# Patient Record
Sex: Female | Born: 1965 | Race: White | Hispanic: No | Smoking: Never smoker
Health system: Southern US, Community
[De-identification: ages and names within clinical notes are randomized; demographics above are authoritative.]

## PROBLEM LIST (undated history)

## (undated) DIAGNOSIS — N200 Calculus of kidney: Secondary | ICD-10-CM

## (undated) DIAGNOSIS — I10 Essential (primary) hypertension: Secondary | ICD-10-CM

## (undated) DIAGNOSIS — F32A Depression, unspecified: Secondary | ICD-10-CM

## (undated) DIAGNOSIS — F419 Anxiety disorder, unspecified: Secondary | ICD-10-CM

## (undated) DIAGNOSIS — E079 Disorder of thyroid, unspecified: Secondary | ICD-10-CM

## (undated) HISTORY — DX: Essential (primary) hypertension: I10

## (undated) HISTORY — DX: Anxiety disorder, unspecified: F41.9

## (undated) HISTORY — DX: Disorder of thyroid, unspecified: E07.9

## (undated) HISTORY — PX: BLADDER SURGERY: SHX569

## (undated) HISTORY — DX: Depression, unspecified: F32.A

## (undated) HISTORY — PX: APPENDECTOMY: SHX54

## (undated) HISTORY — DX: Calculus of kidney: N20.0

---

## 2008-08-16 ENCOUNTER — Ambulatory Visit: Payer: Self-pay | Admitting: Family Medicine

## 2008-08-16 DIAGNOSIS — R1031 Right lower quadrant pain: Secondary | ICD-10-CM

## 2008-08-16 DIAGNOSIS — F17200 Nicotine dependence, unspecified, uncomplicated: Secondary | ICD-10-CM

## 2008-08-16 DIAGNOSIS — D509 Iron deficiency anemia, unspecified: Secondary | ICD-10-CM

## 2008-08-16 DIAGNOSIS — L659 Nonscarring hair loss, unspecified: Secondary | ICD-10-CM | POA: Insufficient documentation

## 2008-08-17 ENCOUNTER — Encounter: Payer: Self-pay | Admitting: Family Medicine

## 2008-08-18 ENCOUNTER — Encounter: Admission: RE | Admit: 2008-08-18 | Discharge: 2008-08-18 | Payer: Self-pay | Admitting: Family Medicine

## 2008-08-18 LAB — CONVERTED CEMR LAB
ALT: 13 units/L (ref 0–35)
Alkaline Phosphatase: 86 units/L (ref 39–117)
Basophils Absolute: 0.1 10*3/uL (ref 0.0–0.1)
Basophils Relative: 1 % (ref 0–1)
CO2: 20 meq/L (ref 19–32)
Cholesterol: 154 mg/dL (ref 0–200)
Creatinine, Ser: 0.85 mg/dL (ref 0.40–1.20)
Eosinophils Absolute: 0.3 10*3/uL (ref 0.0–0.7)
Eosinophils Relative: 3 % (ref 0–5)
Ferritin: 41 ng/mL (ref 10–291)
HCT: 41 % (ref 36.0–46.0)
Hemoglobin: 14.1 g/dL (ref 12.0–15.0)
LDL Cholesterol: 85 mg/dL (ref 0–99)
Lymphocytes Relative: 28 % (ref 12–46)
MCHC: 34.4 g/dL (ref 30.0–36.0)
MCV: 94.5 fL (ref 78.0–100.0)
Monocytes Absolute: 0.6 10*3/uL (ref 0.1–1.0)
Platelets: 302 10*3/uL (ref 150–400)
RDW: 12.6 % (ref 11.5–15.5)
Sodium: 139 meq/L (ref 135–145)
Total Bilirubin: 0.5 mg/dL (ref 0.3–1.2)
Total CHOL/HDL Ratio: 4.2
Total Protein: 7.7 g/dL (ref 6.0–8.3)
Triglycerides: 160 mg/dL — ABNORMAL HIGH (ref <150)
VLDL: 32 mg/dL (ref 0–40)

## 2008-08-25 ENCOUNTER — Ambulatory Visit: Payer: Self-pay | Admitting: Family Medicine

## 2008-08-25 DIAGNOSIS — J984 Other disorders of lung: Secondary | ICD-10-CM | POA: Insufficient documentation

## 2008-08-25 DIAGNOSIS — J069 Acute upper respiratory infection, unspecified: Secondary | ICD-10-CM | POA: Insufficient documentation

## 2008-10-10 ENCOUNTER — Telehealth: Payer: Self-pay | Admitting: Family Medicine

## 2008-12-26 ENCOUNTER — Ambulatory Visit: Payer: Self-pay | Admitting: Family Medicine

## 2008-12-26 DIAGNOSIS — R1013 Epigastric pain: Secondary | ICD-10-CM

## 2008-12-26 DIAGNOSIS — K3189 Other diseases of stomach and duodenum: Secondary | ICD-10-CM | POA: Insufficient documentation

## 2009-01-16 ENCOUNTER — Ambulatory Visit: Payer: Self-pay | Admitting: Family Medicine

## 2009-01-16 DIAGNOSIS — H6120 Impacted cerumen, unspecified ear: Secondary | ICD-10-CM

## 2009-04-11 ENCOUNTER — Ambulatory Visit: Payer: Self-pay | Admitting: Family Medicine

## 2009-04-11 DIAGNOSIS — J309 Allergic rhinitis, unspecified: Secondary | ICD-10-CM | POA: Insufficient documentation

## 2009-04-11 DIAGNOSIS — J019 Acute sinusitis, unspecified: Secondary | ICD-10-CM | POA: Insufficient documentation

## 2009-04-12 ENCOUNTER — Ambulatory Visit: Payer: Self-pay | Admitting: Family Medicine

## 2009-04-12 DIAGNOSIS — K649 Unspecified hemorrhoids: Secondary | ICD-10-CM | POA: Insufficient documentation

## 2009-04-20 ENCOUNTER — Ambulatory Visit: Payer: Self-pay | Admitting: Critical Care Medicine

## 2009-04-20 DIAGNOSIS — K219 Gastro-esophageal reflux disease without esophagitis: Secondary | ICD-10-CM

## 2009-04-21 ENCOUNTER — Ambulatory Visit (HOSPITAL_BASED_OUTPATIENT_CLINIC_OR_DEPARTMENT_OTHER): Admission: RE | Admit: 2009-04-21 | Discharge: 2009-04-21 | Payer: Self-pay | Admitting: Critical Care Medicine

## 2009-04-21 ENCOUNTER — Ambulatory Visit: Payer: Self-pay | Admitting: Diagnostic Radiology

## 2009-04-24 ENCOUNTER — Ambulatory Visit: Payer: Self-pay | Admitting: Occupational Medicine

## 2009-04-24 ENCOUNTER — Telehealth (INDEPENDENT_AMBULATORY_CARE_PROVIDER_SITE_OTHER): Payer: Self-pay | Admitting: *Deleted

## 2009-04-24 DIAGNOSIS — L509 Urticaria, unspecified: Secondary | ICD-10-CM | POA: Insufficient documentation

## 2009-04-25 ENCOUNTER — Telehealth: Payer: Self-pay | Admitting: Family Medicine

## 2009-04-27 ENCOUNTER — Ambulatory Visit: Payer: Self-pay | Admitting: Radiology

## 2009-04-27 ENCOUNTER — Encounter: Payer: Self-pay | Admitting: Critical Care Medicine

## 2009-04-27 ENCOUNTER — Ambulatory Visit (HOSPITAL_BASED_OUTPATIENT_CLINIC_OR_DEPARTMENT_OTHER): Admission: RE | Admit: 2009-04-27 | Discharge: 2009-04-27 | Payer: Self-pay | Admitting: Critical Care Medicine

## 2009-09-05 ENCOUNTER — Ambulatory Visit: Payer: Self-pay | Admitting: Family Medicine

## 2009-09-08 ENCOUNTER — Telehealth: Payer: Self-pay | Admitting: Family Medicine

## 2009-11-25 IMAGING — CT CT ABDOMEN W/ CM
2 of 5 series · 17 of 46 positions shown, 19 images · IV contrast (agent unspecified)
Comparison: None

CT ABDOMEN

CLINICAL DATA: Right lower quadrant pain for several months with
diarrhea

CT ABDOMEN AND PELVIS WITH CONTRAST
TECHNIQUE: Multidetector CT imaging of the abdomen and pelvis was
performed using the standard protocol following bolus
administration of intravenous contrast.
Contrast: 125 ml Amnipaque-LVV

[Series 2: abdomen w/ · axial · 0.91mm/px · z∈[-468,-83]mm · 14 of 89 slices shown, 16 images]
[im 6/89  soft-tissue]
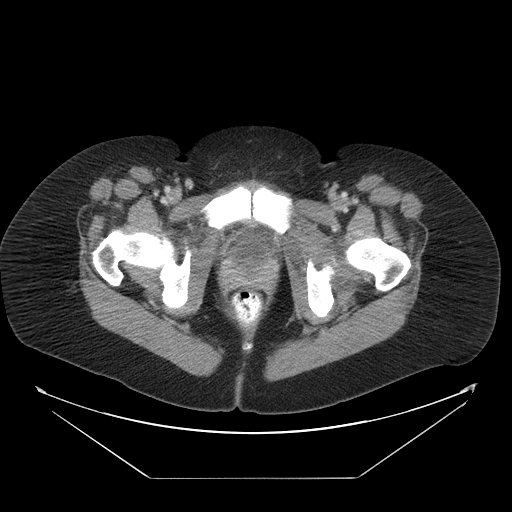
[im 6/89  bone]
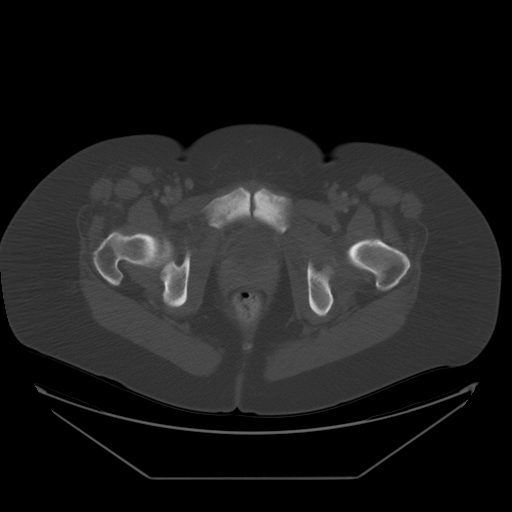
[im 11/89  soft-tissue]
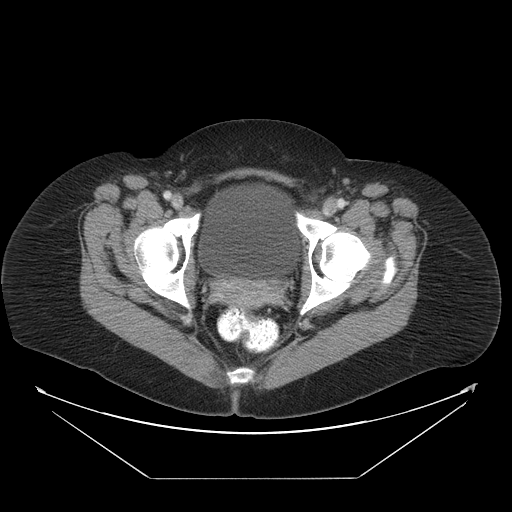
[im 16/89  soft-tissue]
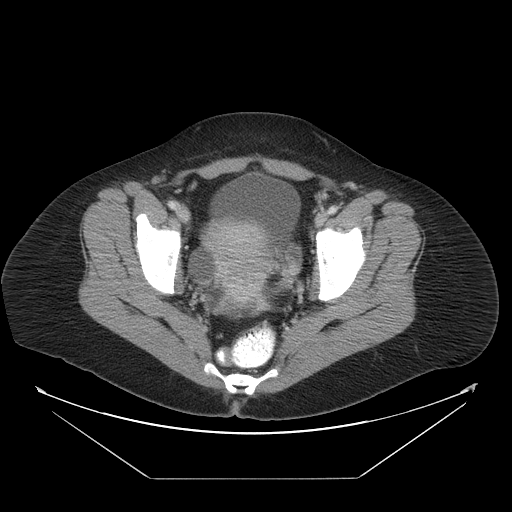
[im 26/89  soft-tissue]
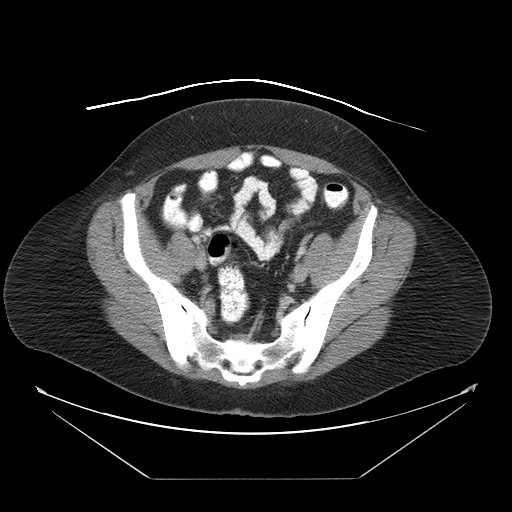
[im 32/89  soft-tissue]
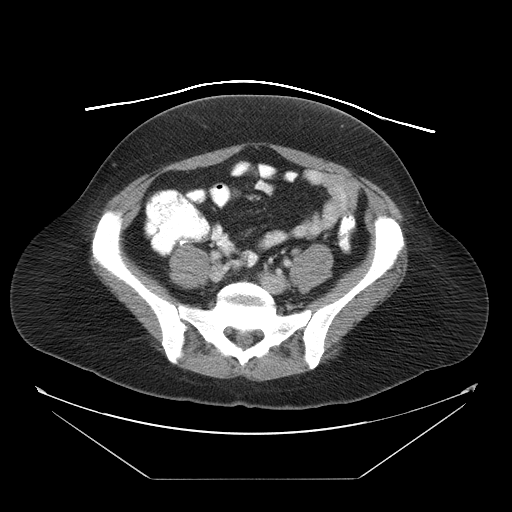
[im 37/89  soft-tissue]
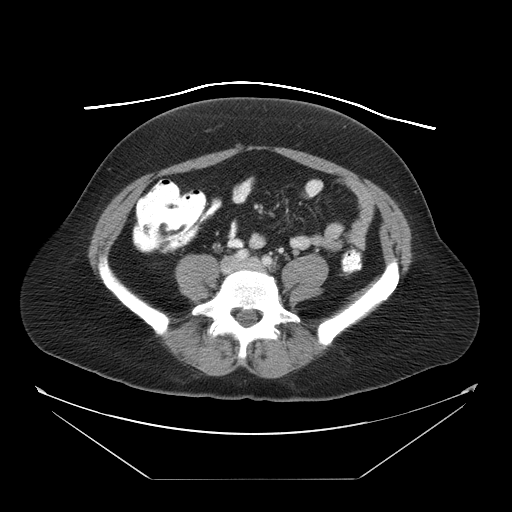
[im 42/89  soft-tissue]
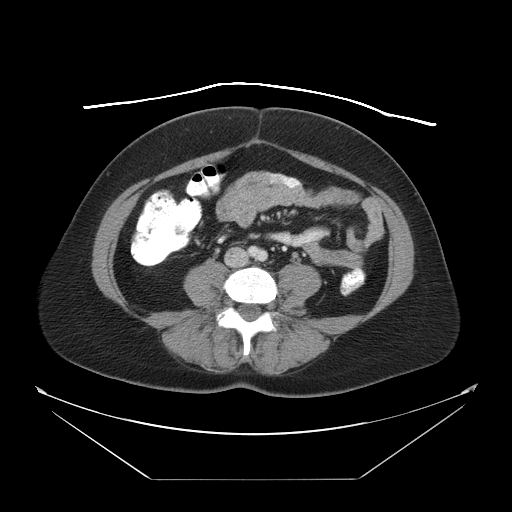
[im 47/89  soft-tissue]
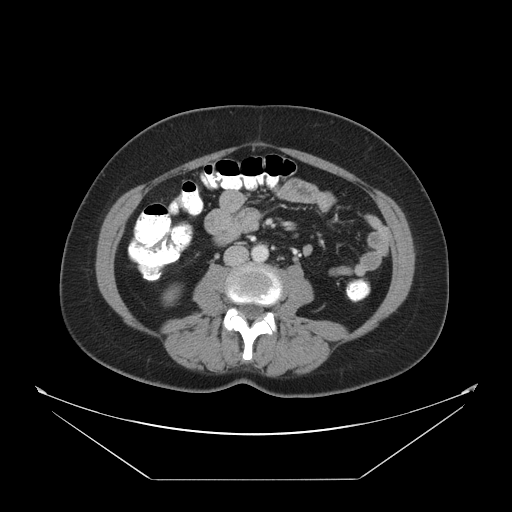
[im 52/89  soft-tissue]
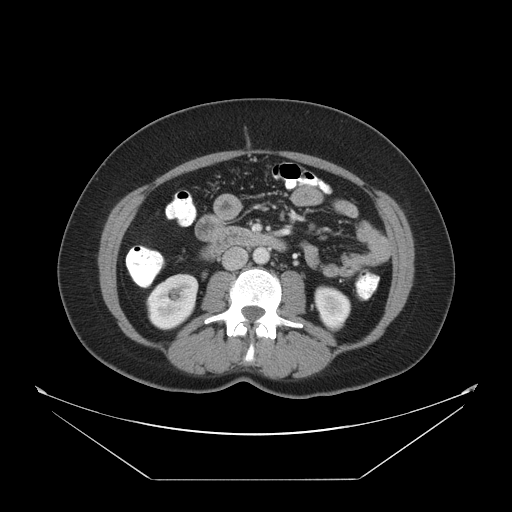
[im 52/89  bone]
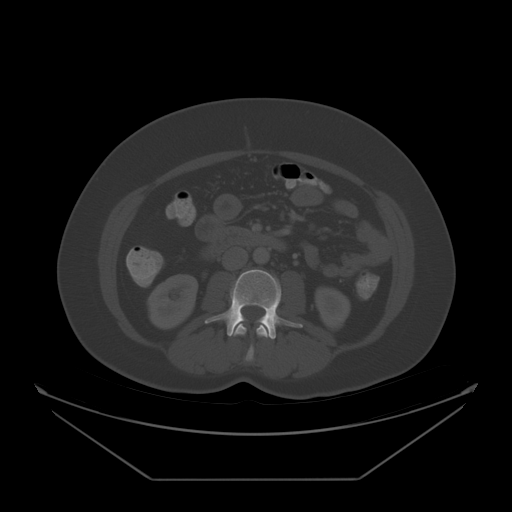
[im 57/89  soft-tissue]
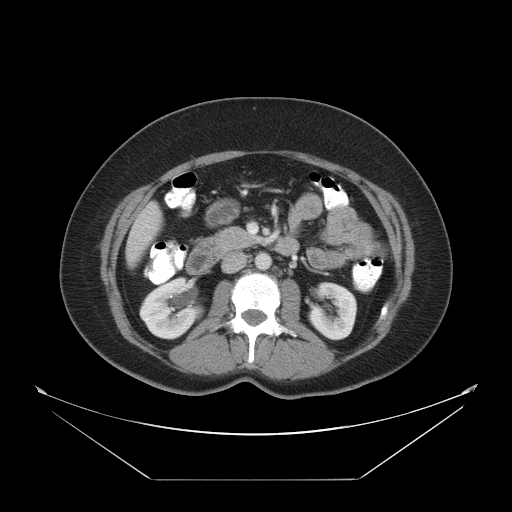
[im 68/89  soft-tissue]
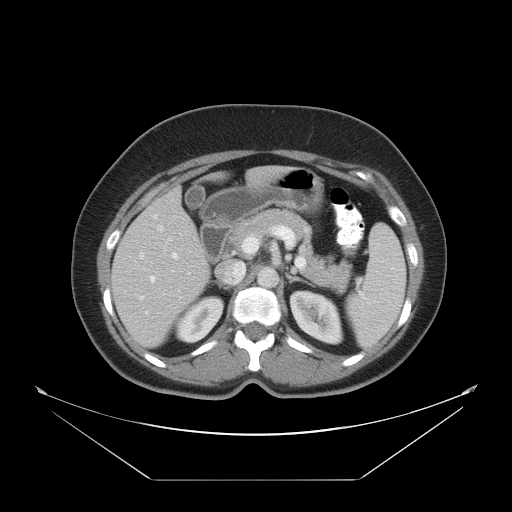
[im 73/89  soft-tissue]
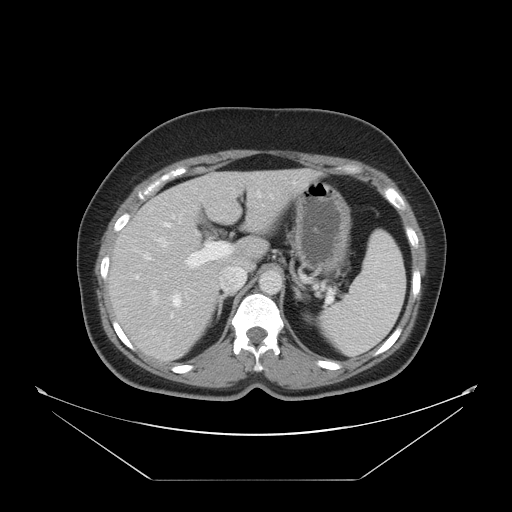
[im 78/89  soft-tissue]
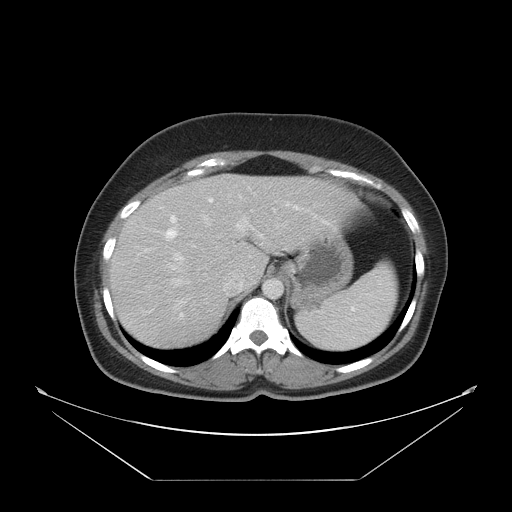
[im 83/89  soft-tissue]
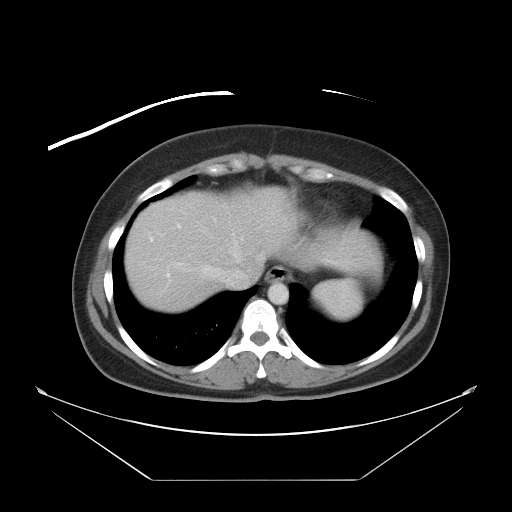

[Series 401: cor · coronal · 0.93mm/px · 3 of 118 slices shown]
[im 40/118  soft-tissue]
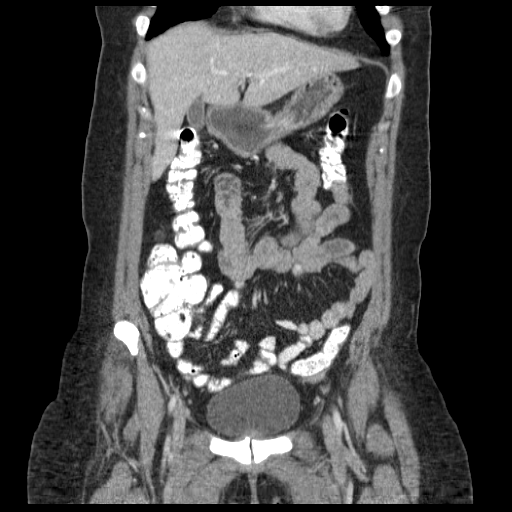
[im 53/118  soft-tissue]
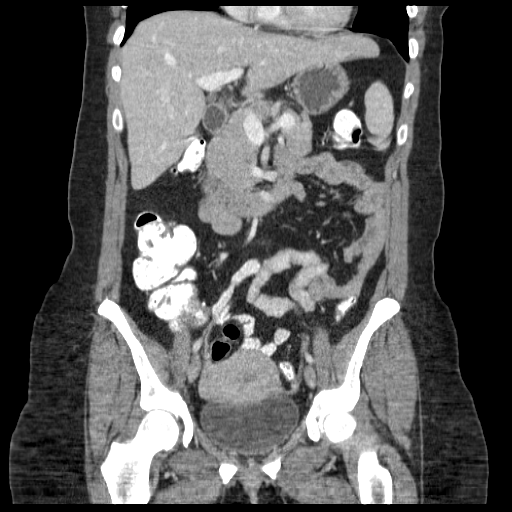
[im 66/118  soft-tissue]
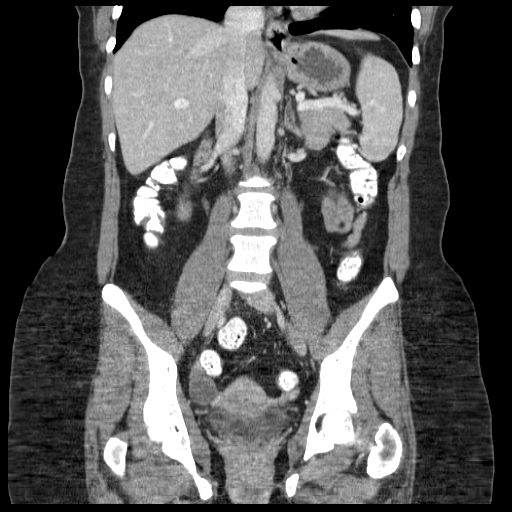

[17 of 46 positions shown; findings below may reference images not displayed]

FINDINGS: There is a questionable vague nodule which appears
pleural based at the posterior medial left lung base on image #3
and follow-up is recommended in 6 months to assess stability.  No
pleural effusion is seen.

The liver enhances with no focal abnormality and no ductal
dilatation is seen.  No calcified gallstones are noted.  The
pancreas is normal in size and the pancreatic duct is not dilated.
The adrenal glands and spleen appear normal.  The kidneys enhance
and there is a small nonobstructing right lower pole renal calculus
present.  On delayed images the pelvocaliceal systems appear
normal.  The abdominal aorta is normal in caliber.
IMPRESSION: 1.  No significant abnormality on CT of the abdomen.
2.  Small nonobstructing right lower pole renal calculus.
3.  Questionable vague nodule at the posterior medial right lung
base.  Consider follow-up in 6 months to assess stability.

CT PELVIS
FINDINGS: The terminal ileum is well seen and appears normal.  By
history the patient has gone undergone appendectomy in August 2007.  The urinary bladder is unremarkable.  The uterus is normal
in size.  There do appear to be low attenuation areas in both
adnexa with the largest ovarian cyst on the right of 3.5 x 2.8 cm.
No free fluid is noted.  No bony abnormality is seen.
IMPRESSION: 1. 2.5 x 2.8 by 3.3 cm right ovarian cyst.  No significant free
fluid.
 2. Terminal ileum appears normal.

## 2010-01-19 ENCOUNTER — Ambulatory Visit: Payer: Self-pay | Admitting: Family Medicine

## 2010-01-19 DIAGNOSIS — R599 Enlarged lymph nodes, unspecified: Secondary | ICD-10-CM | POA: Insufficient documentation

## 2010-07-29 IMAGING — CR DG CHEST 2V
2 series · 2 of 2 positions shown · non-contrast
Comparison: Abdomen and pelvis CT scan 08/18/2008 reviewed.

CLINICAL DATA: Pulmonary nodule.

CHEST - 2 VIEW

[w chest pa]
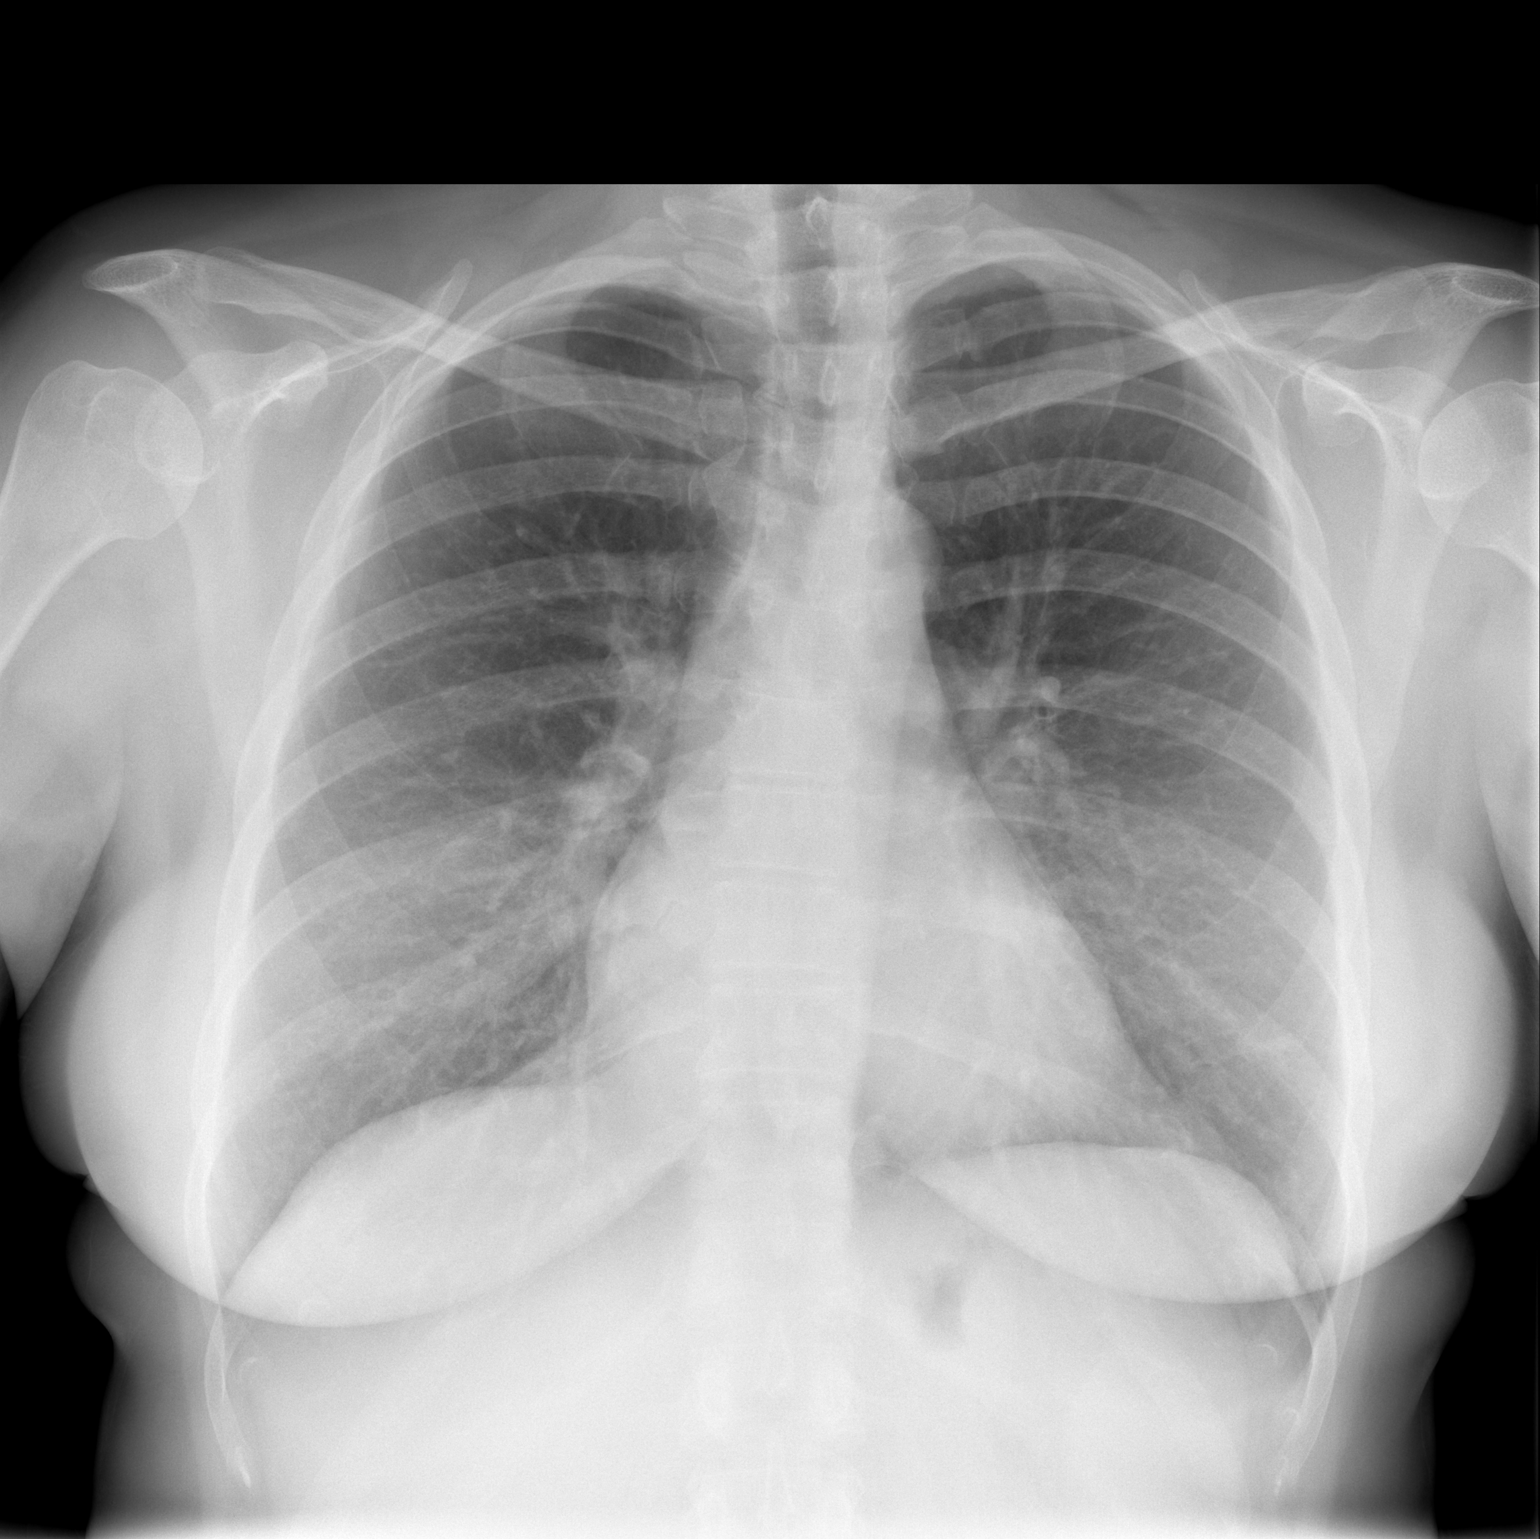

[w chest lat]
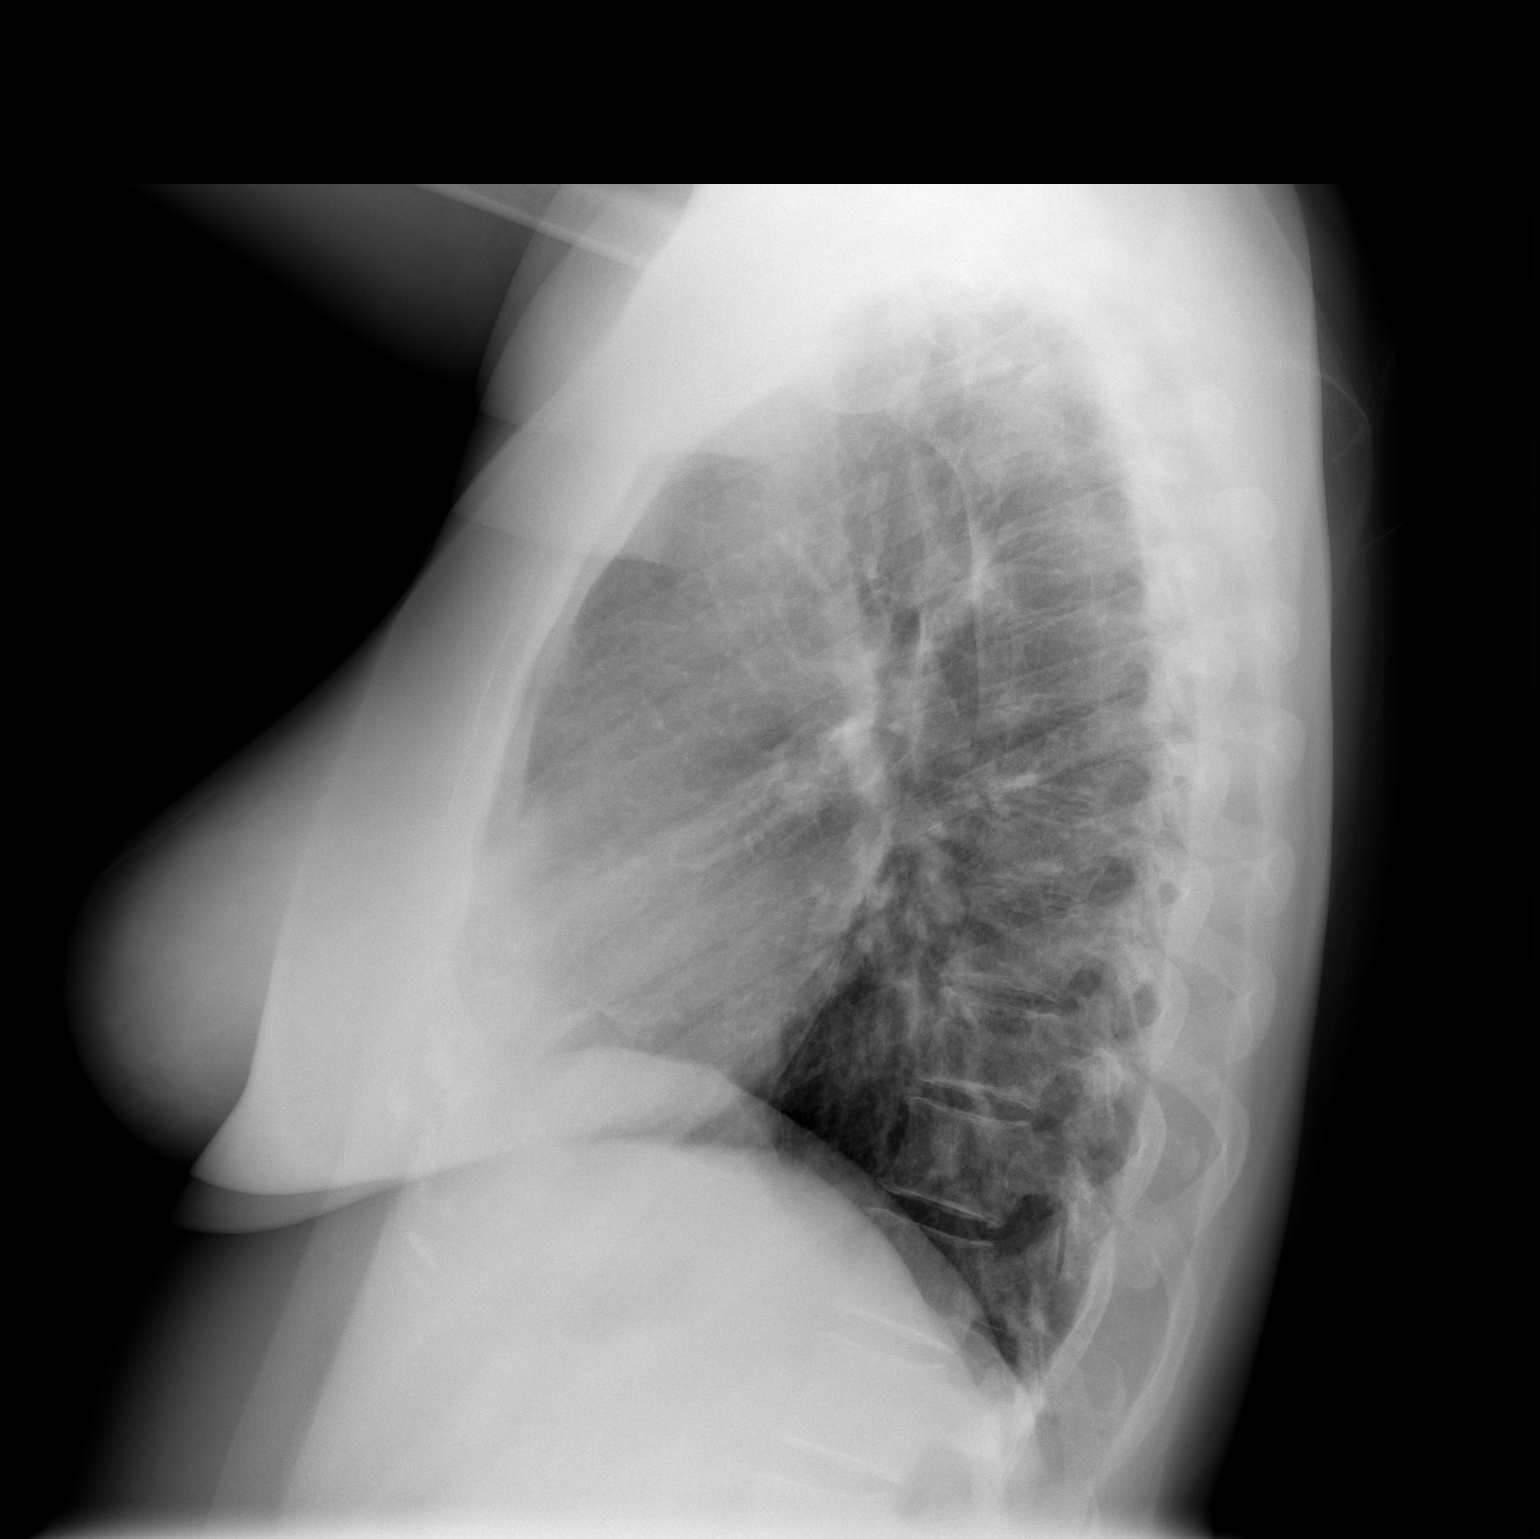

[2 of 2 positions shown; findings below may reference images not displayed]

FINDINGS: The lungs are clear.  No pleural effusion.  Heart size
normal.  No pulmonary nodule is identified.
IMPRESSION: Negative exam.  No pulmonary nodule is visualized.

## 2010-11-25 ENCOUNTER — Encounter: Payer: Self-pay | Admitting: Family Medicine

## 2010-11-26 ENCOUNTER — Encounter: Payer: Self-pay | Admitting: Critical Care Medicine

## 2010-12-06 NOTE — Assessment & Plan Note (Signed)
Summary: allergies/ cervical lymphadenopathy   Vital Signs:  Patient profile:   45 year old female Height:      68.5 inches Weight:      215 pounds BMI:     32.33 O2 Sat:      98 % on Room air Temp:     98.5 degrees F oral Pulse rate:   77 / minute BP sitting:   122 / 78  (left arm) Cuff size:   regular  Vitals Entered By: Payton Spark CMA (January 19, 2010 9:59 AM)  O2 Flow:  Room air CC: Swollen lymph node. Allergies x weeks.    Primary Care Provider:  Seymour Bars DO  CC:  Swollen lymph node. Allergies x weeks. Marland Kitchen  History of Present Illness: 45 yo WF presents for problems with her allergies.  She has tried Dentist and Sudafed PE but continues to have rhinorrhea and L ear pressure.  She noticed a painful lump on the L side of her neck that started yesterday.  Denies sinus pain or pressure.  Denies any fevers.  She has some cold sensitivity on the L side of her mouth.  she does have a dentist.  Denies much pain with chewing.    Current Medications (verified): 1)  None  Allergies (verified): No Known Drug Allergies  Past History:  Past Medical History: Reviewed history from 04/20/2009 and no changes required. allergies [facility]  Past Surgical History: Reviewed history from 04/20/2009 and no changes required. LTCS 4-08 Appy 10-08 [facility] surgical Cyst removal as child  Social History: Reviewed history from 04/12/2009 and no changes required. Unemployed.  Used to work in Audiological scientist. Married to [place].   Has a 25 yr old son. Smokes 1ppd x 27 yrs Poor diet. weighed 165 pre-baby using condoms for birth control.  Review of Systems General:  Complains of fatigue; denies chills and fever. ENT:  Complains of nasal congestion and postnasal drainage; denies ringing in ears, sinus pressure, and sore throat. Resp:  Denies cough, shortness of breath, and wheezing.  Physical Exam  General:  alert, well-developed, well-nourished, and well-hydrated.  obese Head:   normocephalic and atraumatic.  sinuses NTTP Eyes:  conjunctiva clear; no lid edema Ears:  EACs patent; TMs translucent and gray with good cone of light and bony landmarks.  Nose:  boggy, edematous nasal turbinates R>L with scant clear rhinorrhea Mouth:  o/p injected with chronic R>L tonsilar hypertrophy.  No exudates or vesicles.  poor dentition with multiple fillings.   Neck:  supple and full ROM.  enlarged L sided submandibular lymph node with slight tenderness Lungs:  Normal respiratory effort, chest expands symmetrically. Lungs are clear to auscultation, no crackles or wheezes. Heart:  Normal rate and regular rhythm. S1 and S2 normal without gallop, murmur, click, rub or other extra sounds. Skin:  color normal.     Impression & Recommendations:  Problem # 1:  ALLERGIC RHINITIS CAUSE UNSPECIFIED (ICD-477.9) Assessment Deteriorated Start on OTC Zyrtec 10 mg qPM and given sample of nasal steroid today.  Stay on thru the end of April. Her updated medication list for this problem includes:    Zyrtec Allergy 10 Mg Tabs (Cetirizine hcl) .Marland Kitchen... 1 tab by mouth qpm    Omnaris 50 Mcg/act Susp (Ciclesonide) .Marland Kitchen... 2 sprays per nostril once daily  Problem # 2:  CERVICAL LYMPHADENOPATHY, ANTERIOR, LEFT (ICD-785.6) L sided submandibular lymphadenopathy either reactive from dental cary or from a salivary gland stone.  She can try lemon drops today in  the case that it is a salivary gland stone.  This will promote salivation and dislodge a stone, hopefully.  If not improving by Monday, recommend visit to the dentist.  She has quite poor dentition.    Complete Medication List: 1)  Zyrtec Allergy 10 Mg Tabs (Cetirizine hcl) .Marland Kitchen.. 1 tab by mouth qpm 2)  Omnaris 50 Mcg/act Susp (Ciclesonide) .... 2 sprays per nostril once daily  Patient Instructions: 1)  For allergies, start on OTC Zyrtec (generic ceftirizine ok) 10 mg in the evenings AND use Omnaris nasal spray 2 sprays/ nostril once daily. 2)  Stay on  both thru April, then will try to wean off. 3)  Try to suck on lemon drops today and tomorrow for probable salivary gland stone vs dental induced reactive lymph node.  If not improving by Monday, call your dentist. 4)  Return for f/u allergies in 6 wks. Prescriptions: OMNARIS 50 MCG/ACT SUSP (CICLESONIDE) 2 sprays per nostril once daily  #1 bottle x 2   Entered and Authorized by:   Seymour Bars DO   Signed by:   Seymour Bars DO on 01/19/2010   Method used:   Electronically to        [facility] # [phone]* (retail)       [address]       [place], [place]  [address]       Ph: [phone]       Fax: [phone]   RxID:   [id number]

## 2023-01-01 ENCOUNTER — Other Ambulatory Visit (HOSPITAL_COMMUNITY): Payer: Self-pay

## 2023-05-12 ENCOUNTER — Ambulatory Visit: Payer: BC Managed Care – PPO | Admitting: Professional Counselor

## 2023-05-12 ENCOUNTER — Encounter: Payer: Self-pay | Admitting: Professional Counselor

## 2023-05-12 DIAGNOSIS — F22 Delusional disorders: Secondary | ICD-10-CM | POA: Diagnosis not present
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
## 2023-05-14 ENCOUNTER — Encounter: Payer: Self-pay | Admitting: Professional Counselor

## 2023-05-14 NOTE — Patient Instructions (Signed)
Return in one week Follow-up with medical providers as discussed

## 2023-06-13 ENCOUNTER — Encounter: Payer: Self-pay | Admitting: Professional Counselor

## 2023-06-13 ENCOUNTER — Ambulatory Visit (INDEPENDENT_AMBULATORY_CARE_PROVIDER_SITE_OTHER): Payer: BC Managed Care – PPO | Admitting: Professional Counselor

## 2023-06-13 DIAGNOSIS — F22 Delusional disorders: Secondary | ICD-10-CM

## 2023-06-13 NOTE — Progress Notes (Unsigned)
      [facility] Counselor/Therapist Progress Note  Patient ID: Shari Barker, MRN: [id number],    Date: 06/13/2023  Time Spent: ***   Treatment Type: Individual Therapy  Reported Symptoms: ***  Mental Status Exam:  Appearance:   Neat     Behavior:  Appropriate, Sharing, and Motivated  Motor:  Normal  Speech/Language:   Clear and Coherent and Normal Rate  Affect:  Appropriate and Congruent  Mood:  normal  Thought process:  normal  Thought content:    {PSY:[phone]}  Sensory/Perceptual disturbances:    WNL  Orientation:  oriented to person, place, time/date, and situation  Attention:  Good  Concentration:  Good  Memory:  WNL  Fund of knowledge:   Good  Insight:    Good  Judgment:   Good  Impulse Control:  Good   Risk Assessment: Danger to Self:  No Self-injurious Behavior: No Danger to Others: No Duty to Warn:no Physical Aggression / Violence:No  Access to Firearms a concern: No  Gang Involvement:No   Subjective: ***   Interventions: Solution-Oriented/Positive Psychology, Humanistic/Existential, and Insight-Oriented  Diagnosis:   ICD-10-CM   1. Delusional disorder, persecutory type, continuous (HCC)  F22       Plan: ***  Gaspar Bidding, [facility]

## 2023-06-19 NOTE — Progress Notes (Signed)
      [facility] Counselor/Therapist Progress Note  Patient ID: Shari Barker, MRN: [id number],    Date: 8.9.2024  Time Spent: 4:06p - 5:02p   Treatment Type: Individual Therapy  Reported Symptoms: worries, preoccupying thoughts, distress: delusions of paranoid nature, persecutory type  Mental Status Exam:  Appearance:   Neat     Behavior:  Appropriate, Sharing, and Motivated  Motor:  Normal  Speech/Language:   Clear and Coherent and Normal Rate  Affect:  Appropriate and Congruent  Mood:  normal  Thought process:  normal  Thought content:    Delusions  Sensory/Perceptual disturbances:    WNL  Orientation:  oriented to person, place, time/date, and situation  Attention:  Good  Concentration:  Good  Memory:  WNL  Fund of knowledge:   Good  Insight:    Good  Judgment:   Good  Impulse Control:  Good   Risk Assessment: Danger to Self:  No Self-injurious Behavior: No Danger to Others: No Duty to Warn:no Physical Aggression / Violence:No  Access to Firearms a concern: No  Gang Involvement:No   Subjective: Pt presented to session voicing having discussed paranoid ideation with prescribing provider and to have new mediation regimen (Abilify, Seroquel) which she feels to be helping to mitigate symptoms. Pt continued to voice preoccupation with paranoia thoughts related to concrete fears and distress about people she feels to target her. Pt shared narratives with counselor in detail, and counselor worked with pt to honor feelings and experience while also build pt capacity to discern reasonability of paranoid narratives and to deconstruct them to consider alternative representation of reality. Counselor worked with pt to practice CBT interventions and to instill positive self talk.  Interventions: Cognitive Behavioral Therapy, Solution-Oriented/Positive Psychology, Humanistic/Existential, Psycho-education/Bibliotherapy, and Insight-Oriented  Diagnosis:   ICD-10-CM   1.  Delusional disorder, persecutory type, continuous (HCC)  F22       Plan: Pt is scheduled for a follow-up; continue process work and developing coping skills. Pt to practice CBT interventions between sessions, and to journal as she feels comfortable. Continue medication regimen and monitoring with PCP.   Gaspar Bidding, [facility]

## 2023-07-11 ENCOUNTER — Encounter: Payer: Self-pay | Admitting: Professional Counselor

## 2023-07-11 ENCOUNTER — Ambulatory Visit (INDEPENDENT_AMBULATORY_CARE_PROVIDER_SITE_OTHER): Payer: BC Managed Care – PPO | Admitting: Professional Counselor

## 2023-07-11 DIAGNOSIS — F22 Delusional disorders: Secondary | ICD-10-CM | POA: Diagnosis not present

## 2023-07-11 NOTE — Progress Notes (Unsigned)
      [facility] Counselor/Therapist Progress Note  Patient ID: Shari Barker, MRN: [id number],    Date: 07/11/2023  Time Spent: 4:09p - 5:12p   Treatment Type: Individual Therapy  Reported Symptoms: tearfulness, worries, preoccupying thoughts, paranoid ideation, stress, loneliness, grief/loss, isolating tendencies   Mental Status Exam:  Appearance:   Neat     Behavior:  Appropriate and Sharing  Motor:  Normal  Speech/Language:   Clear and Coherent and Normal Rate  Affect:  Appropriate and Congruent  Mood:  normal  Thought process:  normal  Thought content:    WNL  Sensory/Perceptual disturbances:    WNL  Orientation:  oriented to person, place, time/date, and situation  Attention:  Good  Concentration:  Good  Memory:  WNL  Fund of knowledge:   Good  Insight:    Good  Judgment:   Good  Impulse Control:  Good   Risk Assessment: Danger to Self:  No Self-injurious Behavior: No Danger to Others: No Duty to Warn:no Physical Aggression / Violence:No  Access to Firearms a concern: No  Gang Involvement:No   Subjective: Pt presented to session voicing self-led decision to discontinue Abilify due to side effects, and to return to Seroquel, and to maintain Buspirone. Pt identified sleeping too much and unable to go about daily routine. Counselor recommend pt medication management supervision, and considering a psychiatrist to manage medications ongoing forward. Pt voiced consideration of recommendation. Pt identified avoiding shopping due to avoidance of experiencing paranoid episodes; counselor and pt discussed avoidance as not being pt goal, and discussed safety-building strategies from last session to mitigate symptoms. Counselor recommended pt resource guided meditation in safe space such as home or parked vehicle prior to entering stores or going on feared outing. Counselor facilitated meditation focused on breathing, relaxing, and self compassion, which pt engaged in and  found helpful. Pt processed grief and loss around loss of parents, and father's anniversary of death, and her longing regarding relationship with brother. Counselor recommended pt write therapeutic letter to brother (that she not send per se) that affirms her languaging of her sense of hurt in order to reframe current narrative of self blame in circumstance.   Interventions: Solution-Oriented/Positive Psychology, Humanistic/Existential, and Insight-Oriented, Grief Therapy   Diagnosis:   ICD-10-CM   1. Delusional disorder, persecutory type, continuous (HCC)  F22       Plan: Pt is scheduled for a follow-up; continue process work and developing coping skills. Pt to resource guided and relaxation meditation and to practice therapeutic letter writing as discussed.   Gaspar Bidding, [facility]

## 2023-08-29 ENCOUNTER — Ambulatory Visit: Payer: BC Managed Care – PPO | Admitting: Professional Counselor

## 2023-08-29 ENCOUNTER — Encounter: Payer: Self-pay | Admitting: Professional Counselor

## 2023-08-29 DIAGNOSIS — F330 Major depressive disorder, recurrent, mild: Secondary | ICD-10-CM | POA: Diagnosis not present

## 2023-08-29 DIAGNOSIS — F22 Delusional disorders: Secondary | ICD-10-CM | POA: Diagnosis not present

## 2023-08-29 NOTE — Progress Notes (Unsigned)
      [facility] Counselor/Therapist Progress Note  Patient ID: Shari Barker, MRN: [id number],    Date: 08/29/2023  Time Spent: 4:09p - 5:11p   Treatment Type: Individual Therapy  Reported Symptoms: tearfulness, sadness, stress, isolating behavior, paranoid intrusive thoughts  Mental Status Exam:  Appearance:   Casual     Behavior:  Appropriate and Sharing  Motor:  Normal  Speech/Language:   Clear and Coherent and Normal Rate  Affect:  Depressed and Tearful  Mood:  depressed and sad  Thought process:  normal  Thought content:    Delusions, persecutory type  Sensory/Perceptual disturbances:    WNL  Orientation:  Sound  Attention:  Good  Concentration:  Good  Memory:  WNL  Fund of knowledge:   Good  Insight:    Good  Judgment:   Good  Impulse Control:  Good   Risk Assessment: Danger to Self:  Yes.  without intent/plan vague SI Self-injurious Behavior: No Danger to Others: No Duty to Warn:no Physical Aggression / Violence:No  Access to Firearms a concern: No  Gang Involvement:No   Subjective: Pt prensted to session voicing limited progress since last session and exacerbated depressive symptoms due to heightened financial strain; counselor helped pt organize thoughts and develop proactive, self advocating strategies to address these concerns. Pt voiced gratitude and helpfulness of strategies and that she felt better in control of the situation after talking. Pt reported self guidance not to be taking buspirone but to be continuing Seroquel; counselor encouraged pt communication with prescribing provider. Counselor and pt discussed pt treatment plan and pt gave her consent. She reported paranoid delusions to be decreased due to isolating behavior; counselor and pt made plan to continue to discuss further next session. STG from this session: Pt will 1) readdress medication management with prescribing provider 2) address tasks related to financial burdens 3) work on  isolating behaviors pt utilizes in effort to avoid paranoid episodes per techniques discussed in past and this session, such as grounding in car prior to entering store, and other techniques to mitigate symptomology.   Interventions: Solution-Oriented/Positive Psychology, Humanistic/Existential, and Insight-Oriented  Diagnosis:   ICD-10-CM   1. Major depressive disorder, recurrent episode, mild (HCC)  F33.0     2. Delusional disorder, persecutory type, continuous (HCC)  F22       Plan: Pt to return in one month; continue process work and Conservation officer, historic buildings.   Gaspar Bidding, [facility]

## 2023-09-26 ENCOUNTER — Encounter: Payer: Self-pay | Admitting: Professional Counselor

## 2023-09-26 ENCOUNTER — Ambulatory Visit (INDEPENDENT_AMBULATORY_CARE_PROVIDER_SITE_OTHER): Payer: BC Managed Care – PPO | Admitting: Professional Counselor

## 2023-09-26 DIAGNOSIS — F22 Delusional disorders: Secondary | ICD-10-CM

## 2023-09-26 NOTE — Progress Notes (Signed)
      [facility] Counselor/Therapist Progress Note  Patient ID: Shari Barker, MRN: [id number],    Date: 09/26/2023  Time Spent: 4:17 PM to 5:30 PM  Treatment Type: Individual Therapy  Reported Symptoms: Intrusive, delusional thoughts of paranoid nature, stress, interpersonal concerns  Mental Status Exam:  Appearance:   Casual     Behavior:  Appropriate and Sharing  Motor:  Normal  Speech/Language:   Clear and Coherent and Normal Rate  Affect:  Appropriate and Congruent  Mood:  normal  Thought process:  normal  Thought content:    Paranoid Ideation  Sensory/Perceptual disturbances:    WNL  Orientation:  Sound  Attention:  Good  Concentration:  Good  Memory:  WNL  Fund of knowledge:   Good  Insight:    Fair  Judgment:   Good  Impulse Control:  Good   Risk Assessment: Danger to Self:  No Self-injurious Behavior: No Danger to Others: No Duty to Warn:no Physical Aggression / Violence:No  Access to Firearms a concern: No  Gang Involvement:No   Subjective: Patient presented to session to address concerns of delusional disorder persecutory type.  She reported minimal progress at this time.  Patient voiced taking her sleep medicine Seroquel earlier in the evenings recently.  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  She also identified addressing goal of speaking to lawyers about family estate concerns, with outcome to be determined.  She voiced being a bit more hopeful on both fronts.  Patient shared regarding recent paranoid episodes and discussed circumstances and her responses at length with counselor.  She traced triggers and her feelings back to family of origin experiences and processed trauma with counselor.  She was able to place a sense of mistrust with these early life experiences.  Counselor and patient worked to [employer] from likely realities,  and while patient is able to intellectually discern difference upon discussion, she voiced it still being difficult to let go of feelings of being watched, followed, and other cognitive distortions in the moment.    Patient voiced prayer as a resource for her, and that she tries to utilize CBT techniques when distressed by preoccupations.  Interventions: Solution-Oriented/Positive Psychology, Humanistic/Existential, and Insight-Oriented, CBT  Diagnosis:   ICD-10-CM   1. Delusional disorder, persecutory type, continuous (HCC)  F22       Plan: Patient is scheduled for follow-up; continue process work and developing coping skills.  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  Progress note dictated via Dragon and checked for accuracy.  Gaspar Bidding, [facility]

## 2023-10-31 ENCOUNTER — Ambulatory Visit (INDEPENDENT_AMBULATORY_CARE_PROVIDER_SITE_OTHER): Payer: BC Managed Care – PPO | Admitting: Professional Counselor

## 2023-10-31 ENCOUNTER — Encounter: Payer: Self-pay | Admitting: Professional Counselor

## 2023-10-31 DIAGNOSIS — F22 Delusional disorders: Secondary | ICD-10-CM

## 2023-10-31 DIAGNOSIS — F330 Major depressive disorder, recurrent, mild: Secondary | ICD-10-CM

## 2023-10-31 NOTE — Progress Notes (Unsigned)
      [facility] Counselor/Therapist Progress Note  Patient ID: Shari Barker, MRN: [id number],    Date: 10/31/2023  Time Spent: ***   Treatment Type: Individual Therapy  Reported Symptoms: ***  Mental Status Exam:  Appearance:   Casual     Behavior:  Appropriate, Sharing, and Motivated  Motor:  Normal  Speech/Language:   Clear and Coherent and Normal Rate  Affect:  Appropriate and Congruent  Mood:  normal  Thought process:  normal  Thought content:    WNL  Sensory/Perceptual disturbances:    WNL  Orientation:  Sound  Attention:  Good  Concentration:  Good  Memory:  WNL  Fund of knowledge:   Good  Insight:    Good  Judgment:   Good  Impulse Control:  Good   Risk Assessment: Danger to Self:  No Self-injurious Behavior: No Danger to Others: No Duty to Warn:no Physical Aggression / Violence:No  Access to Firearms a concern: No  Gang Involvement:No   Subjective: ***   Interventions: Solution-Oriented/Positive Psychology, Humanistic/Existential, and Insight-Oriented  Diagnosis:   ICD-10-CM   1. Delusional disorder, persecutory type, continuous (HCC)  F22     2. Major depressive disorder, recurrent episode, mild (HCC)  F33.0       Plan: ***  Gaspar Bidding, [facility]

## 2023-11-27 ENCOUNTER — Ambulatory Visit (INDEPENDENT_AMBULATORY_CARE_PROVIDER_SITE_OTHER): Payer: BC Managed Care – PPO | Admitting: Physician Assistant

## 2023-11-27 ENCOUNTER — Encounter: Payer: Self-pay | Admitting: Physician Assistant

## 2023-11-27 VITALS — BP 133/86 | HR 102 | Ht 67.0 in | Wt 238.0 lb

## 2023-11-27 DIAGNOSIS — F4323 Adjustment disorder with mixed anxiety and depressed mood: Secondary | ICD-10-CM

## 2023-11-27 DIAGNOSIS — R440 Auditory hallucinations: Secondary | ICD-10-CM

## 2023-11-27 DIAGNOSIS — F22 Delusional disorders: Secondary | ICD-10-CM

## 2023-11-27 MED ORDER — BUPROPION HCL ER (XL) 150 MG PO TB24: 150.0000 mg | ORAL_TABLET | Freq: Every day | ORAL | 1 refills | Status: DC

## 2023-11-27 MED ORDER — QUETIAPINE FUMARATE ER 150 MG PO TB24: 150.0000 mg | ORAL_TABLET | Freq: Every day | ORAL | 1 refills | Status: DC

## 2023-11-27 NOTE — Progress Notes (Signed)
[facility] MD/PA/NP Initial Note  11/27/2023 1:41 PM Shari Barker  MRN:  [id number]  Chief Complaint:  Chief Complaint   Establish Care    HPI:   Depressed. Lost job.  Is anxious all the time.  No panic attacks but feels overwhelmed.  Has intrusive thoughts.  He does not enjoy much of anything.  ADLs and personal hygiene are normal.  Appetite is normal and weight is stable.  Does not cry easily.  She does feel hopeless sometimes.  Not sure what she will do about employment.  Energy and motivation are fair to good depending on the day.  No suicidal or homicidal thoughts.  Paranoid when she goes out, prefers to stay at home. Has heard voices, started before she started seeing Lieutenant Diego, [facility] in 05/2023. Feels like people are talking about her. And following her. Patient denies increased energy with decreased need for sleep, increased talkativeness, impulsivity or risky behaviors, increased spending, increased libido, grandiosity, increased irritability or anger.  Visit Diagnosis:    ICD-10-CM   1. Situational mixed anxiety and depressive disorder  F43.23     2. Paranoia (HCC)  F22     3. Auditory hallucinations  R44.0       Past Psychiatric History:   Past medications for mental health diagnoses include: Risperdal a few times, she was afraid to take it. Wellbutrin, Buspar, Seroquel for sleep  Never hosp for psych. No h/o self-harm  Past Medical History:  Past Medical History:  Diagnosis Date   Anxiety    Depression    Hypertension    Kidney stones    Thyroid disease     Past Surgical History:  Procedure Laterality Date   APPENDECTOMY     BLADDER SURGERY     CESAREAN SECTION     Family Psychiatric History:  See below  Family History:  Family History  Problem Relation Age of Onset   Dementia Mother    Hypertension Mother    Diabetes Mellitus II Mother    Thyroid disease Mother    Heart attack Father    Diverticulitis Brother    Healthy Son     Social History:  Social History   Socioeconomic History   Marital status: Widowed    Spouse name: Not on file   Number of children: Not on file   Years of education: Not on file   Highest education level: Associate degree: occupational, Scientist, product/process development, or vocational program  Occupational History   Not on file  Tobacco Use   Smoking status: Never   Smokeless tobacco: Never  Substance and Sexual Activity   Alcohol use: Not Currently   Drug use: Not Currently   Sexual activity: Not on file  Other Topics Concern   Not on file  Social History Narrative   Widow, she and son live together.   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    Never abused      Legal-none   Religion-[employer]   Caffeine-3-4 soft drinks   Social Drivers of Health   Financial Resource Strain: Medium Risk (11/27/2023)   Overall Financial Resource Strain (CARDIA)    Difficulty of Paying Living Expenses: Somewhat hard  Food Insecurity: No Food Insecurity (11/27/2023)   Hunger Vital Sign    Worried About Running Out of Food in the Last Year: Never true    Ran Out of Food in the Last Year: Never true  Transportation Needs: No Transportation Needs (11/27/2023)   PRAPARE - Transportation  Lack of Transportation (Medical): No    Lack of Transportation (Non-Medical): No  Physical Activity: Inactive (11/27/2023)   Exercise Vital Sign    Days of Exercise per Week: 0 days    Minutes of Exercise per Session: 0 min  Stress: Stress Concern Present (11/27/2023)   [employer] of Occupational Health - Occupational Stress Questionnaire    Feeling of Stress : Very much  Social Connections: Socially Isolated (11/27/2023)   Social Connection and Isolation Panel [NHANES]    Frequency of Communication with Friends and Family: Never    Frequency of Social Gatherings with Friends and Family: Never    Attends Religious Services: Never    Database administrator or Organizations: No    Attends Banker  Meetings: Never    Marital Status: Widowed    Allergies:  Allergies  Allergen Reactions   Amoxicillin-Pot Clavulanate Other (See Comments)    Yeast infection   Metabolic Disorder Labs: No results found for: "HGBA1C", "MPG" No results found for: "PROLACTIN" Lab Results  Component Value Date   CHOL 154 08/16/2008   TRIG 160 (H) 08/16/2008   HDL 37 (L) 08/16/2008   CHOLHDL 4.2 Ratio 08/16/2008   VLDL 32 08/16/2008   LDLCALC 85 08/16/2008   Lab Results  Component Value Date   TSH 1.280 08/16/2008   Therapeutic Level Labs: No results found for: "LITHIUM" No results found for: "VALPROATE" No results found for: "CBMZ"  Current Medications: Current Outpatient Medications  Medication Sig Dispense Refill   busPIRone (BUSPAR) 15 MG tablet Take 10 mg by mouth 2 (two) times daily.     clobetasol ointment (TEMOVATE) 0.05 % Apply 1 Application topically 2 (two) times daily.     Cyanocobalamin (VITAMIN B 12 PO) Take by mouth.     estradiol (ESTRACE) 0.1 MG/GM vaginal cream Place 1 Applicatorful vaginally at bedtime.     famotidine (PEPCID) 20 MG tablet Take 20 mg by mouth 2 (two) times daily.     finasteride (PROSCAR) 5 MG tablet Take 5 mg by mouth daily.     ketotifen (ZADITOR) 0.035 % ophthalmic solution 1 drop in the morning and at bedtime.     levothyroxine (SYNTHROID) 112 MCG tablet Take 112 mcg by mouth daily before breakfast.     lisinopril (ZESTRIL) 5 MG tablet Take 5 mg by mouth daily.     QUEtiapine Fumarate (SEROQUEL XR) 150 MG 24 hr tablet Take 1 tablet (150 mg total) by mouth at bedtime. 30 tablet 1   triamcinolone (KENALOG) 0.025 % ointment Apply 1 Application topically 2 (two) times daily.     Triamcinolone Acetonide (NASACORT AQ NA) Place into the nose.     VITAMIN D PO Take by mouth.     buPROPion (WELLBUTRIN XL) 150 MG 24 hr tablet Take 1 tablet (150 mg total) by mouth daily. 30 tablet 1   No current facility-administered medications for this visit.    Medication  Side Effects: none  Orders placed this visit:  No orders of the defined types were placed in this encounter.  Psychiatric Specialty Exam:  Review of Systems  Constitutional: Negative.   HENT: Negative.    Eyes: Negative.   Respiratory: Negative.    Cardiovascular: Negative.   Gastrointestinal: Negative.   Endocrine: Negative.   Genitourinary: Negative.   Skin: Negative.   Allergic/Immunologic: Negative.   Neurological: Negative.   Hematological: Negative.   Psychiatric/Behavioral:         See HPI    Blood pressure  133/86, pulse (!) 102, height 5\' 7"  (1.702 m), weight 238 lb (108 kg).Body mass index is 37.28 kg/m.  General Appearance: Casual and Well Groomed  Eye Contact:  Good  Speech:  Clear and Coherent and Normal Rate  Volume:  Normal  Mood:  Anxious and Depressed  Affect:  Depressed and Anxious  Thought Process:  Goal Directed and Descriptions of Associations: Circumstantial  Orientation:  Full (Time, Place, and Person)  Thought Content: Hallucinations: Auditory   Suicidal Thoughts:  No  Homicidal Thoughts:  No  Memory:  WNL  Judgement:  Good  Insight:  Good  Psychomotor Activity:  Normal  Concentration:  Concentration: Fair  Recall:  Good  Fund of Knowledge: Good  Language: Good  Assets:  Desire for Improvement Housing Transportation  ADL's:  Intact  Cognition: WNL  Prognosis:  Good   PCP follows labs.  Most recently done 11/12/2023.  See results on chart.  Random glucose 124, last lipid panel was 04/24/2022 and completely normal.  Screenings:  GAD-7    Flowsheet Row Counselor from 09/26/2023 in [facility] Counselor from 08/29/2023 in [facility] Counselor from 05/12/2023 in [facility]  Total GAD-7 Score 6 4 5       PHQ2-9    Flowsheet Row Office Visit from 11/27/2023 in [facility] Counselor from 09/26/2023 in [facility] Counselor from 08/29/2023 in [facility] Counselor from 05/12/2023 in [facility]  PHQ-2 Total Score 5 1 3  0  PHQ-9 Total Score 13 3 7  --       Receiving Psychotherapy: Yes with Lieutenant Diego, [facility]  Treatment Plan/Recommendations:   PDMP reviewed.  Ativan filled 05/12/2023. I provided 60 minutes of face to face time during this encounter, including time spent before and after the visit in records review, medical decision making, counseling pertinent to today's visit, and charting.   We discussed her symptoms.  I recommend increasing the Seroquel.  Explained the reasoning.  When she sees primary provider soon she will request a lipid panel be done.  Continue Wellbutrin XL 150 mg, 1 p.o. every morning. Continue BuSpar 10 mg, 1 p.o. twice daily. Increase Seroquel XR to 150 mg, 1 p.o. nightly. Continue vitamins as per med list. Continue therapy with Lieutenant Diego, [facility]. Return in 4 to 6 weeks.  Melony Overly, PA-C

## 2023-12-01 ENCOUNTER — Encounter: Payer: Self-pay | Admitting: Physician Assistant

## 2023-12-04 ENCOUNTER — Telehealth: Payer: Self-pay | Admitting: Physician Assistant

## 2023-12-04 NOTE — Telephone Encounter (Signed)
Yes, she can try it. It's long-acting though so might not work as well, but definitely worth a try. Have her take the Seroquel earlier in the evening and hopefully she won't be as sleepy. Being sick can also cause fatigue.

## 2023-12-04 NOTE — Telephone Encounter (Signed)
I called pt about an appt opening at 2:07p.  She states that the Seroquel 150mg  that Rosey Bath started her on is making her too sleepy.  She started taking the 25mg  that she had.  She wants someone to call her back about how to proceed.  Next appt 3/11

## 2023-12-04 NOTE — Telephone Encounter (Signed)
Shari Barker reports that she currently has a sinus infection. She has been prescribed doxycycline. She reports that Seroquel 150 mg is making her sleepy all the time. She says that with the 25 mg she was able to sleep some. She is stressed out. She is looking for a job and does not have time to be so sleepy. She would like to know if its okay to split the 150's in half to take 75 mg and see if that would be beneficial.   Please advise.

## 2023-12-05 ENCOUNTER — Encounter: Payer: Self-pay | Admitting: Professional Counselor

## 2023-12-05 ENCOUNTER — Ambulatory Visit: Payer: BC Managed Care – PPO | Admitting: Professional Counselor

## 2023-12-05 DIAGNOSIS — F22 Delusional disorders: Secondary | ICD-10-CM

## 2023-12-05 DIAGNOSIS — F331 Major depressive disorder, recurrent, moderate: Secondary | ICD-10-CM

## 2023-12-05 NOTE — Progress Notes (Signed)
      [facility] Counselor/Therapist Progress Note  Patient ID: Shari Barker, MRN: [id number],    Date: 12/05/2023  Time Spent: 1:06 PM to 2:07 PM  Treatment Type: Individual Therapy  Reported Symptoms: Sleeplessness, fears, worries, mind racing, low self-esteem, low mood, tearfulness, intrusive thoughts, social isolation  Mental Status Exam:  Appearance:   Casual     Behavior:  Appropriate, Sharing, and Motivated  Motor:  Normal  Speech/Language:   Clear and Coherent and Normal Rate  Affect:  Depressed and Tearful  Mood:  anxious, depressed, and sad  Thought process:  normal  Thought content:    WNL  Sensory/Perceptual disturbances:    WNL  Orientation:  oriented to person, place, time/date, and situation  Attention:  Good  Concentration:  Good  Memory:  WNL  Fund of knowledge:   Good  Insight:    Good  Judgment:   Good  Impulse Control:  Good   Risk Assessment: Danger to Self:  No Self-injurious Behavior: No Danger to Others: No Duty to Warn:no Physical Aggression / Violence:No  Access to Firearms a concern: No  Gang Involvement:No   Subjective: Patient presented to session to address concerns of delusions and depression.  She reported minimal progress.  Counselor and patient discussed patient PHQ-9 score of 13 from another visit January 23 and patient voiced symptoms to be the same.  Patient reported having seen prescribing provider at practice and to be on Seroquel, and hopeful to see improvements.  Patient processed experience of 2 marked paranoia incidents, and counselor and patient discussed patient triggers, and strategies of cognitive restructuring. Patient processed experience of having been laid off from work since last session.  She identified resume writing and job Radiographer, therapeutic.  She reported having had 1 interview and waiting for unemployment.   Patient identified her spirituality as a helpful coping mechanism.  Counselor worked to  [employer] patient sense of inner strengths and self-esteem, assisting patient to identify her skill set and her resiliency.    Interventions: Solution-Oriented/Positive Psychology, Humanistic/Existential, and Insight-Oriented, CBT  Diagnosis:   ICD-10-CM   1. Delusional disorder, persecutory type, continuous (HCC)  F22     2. Major depressive disorder, recurrent episode, moderate (HCC)  F33.1       Plan: Patient is scheduled for follow-up; continue process work and developing coping skills.  Patient personal goal between sessions to increase opportunities for community, perhaps via church attendance, and reflect on a person she might consider reaching out to as a friend.  Progress note was dictated with Dragon and reviewed for accuracy.  Gaspar Bidding, [facility]

## 2023-12-05 NOTE — Telephone Encounter (Signed)
 Pt.notified

## 2023-12-12 ENCOUNTER — Ambulatory Visit: Payer: BC Managed Care – PPO | Admitting: Professional Counselor

## 2023-12-19 ENCOUNTER — Ambulatory Visit: Payer: BC Managed Care – PPO | Admitting: Professional Counselor

## 2023-12-24 ENCOUNTER — Ambulatory Visit: Payer: BC Managed Care – PPO | Admitting: Physician Assistant

## 2023-12-29 ENCOUNTER — Other Ambulatory Visit: Payer: Self-pay | Admitting: Physician Assistant

## 2024-01-13 ENCOUNTER — Ambulatory Visit: Payer: BC Managed Care – PPO | Admitting: Physician Assistant

## 2024-01-13 ENCOUNTER — Encounter: Payer: Self-pay | Admitting: Physician Assistant

## 2024-01-13 DIAGNOSIS — F4323 Adjustment disorder with mixed anxiety and depressed mood: Secondary | ICD-10-CM

## 2024-01-13 DIAGNOSIS — F22 Delusional disorders: Secondary | ICD-10-CM | POA: Diagnosis not present

## 2024-01-13 DIAGNOSIS — R440 Auditory hallucinations: Secondary | ICD-10-CM

## 2024-01-13 MED ORDER — BUPROPION HCL ER (XL) 150 MG PO TB24: 150.0000 mg | ORAL_TABLET | Freq: Every day | ORAL | 1 refills | Status: DC

## 2024-01-13 NOTE — Progress Notes (Signed)
 [facility] Med Check  Patient ID: Shari Barker,  MRN: [id number]  PCP: Lavonda Jumbo, PA-C  Date of Evaluation: 01/13/2024 Time spent:20 minutes  Chief Complaint:  Chief Complaint   Depression; Anxiety; Insomnia; Follow-up    HISTORY/CURRENT STATUS: HPI For 6 week med check.   Increased the Seroquel 6 weeks ago for AH.  It caused more fatigue which was intolerable. She stopped it. Denies hallucinations. Patient denies increased energy with decreased need for sleep, increased talkativeness, racing thoughts, impulsivity or risky behaviors, increased spending, increased libido, grandiosity, increased irritability or anger, or paranoia.  Hasn't been able to find a job yet and understandably concerned about that. Feels like the meds are working as well as they can.  Energy and motivation are 'ok.'   No extreme sadness, tearfulness, or feelings of hopelessness.  Sleeps well most of the time. ADLs and personal hygiene are normal.   Denies any changes in concentration, making decisions, or remembering things.  Appetite has not changed.  Weight is stable.  Denies suicidal or homicidal thoughts.  Denies dizziness, syncope, seizures, numbness, tingling, tremor, tics, unsteady gait, slurred speech, confusion. Denies muscle or joint pain, stiffness, or dystonia.  Individual Medical History/ Review of Systems: Changes? :No   Past medications for mental health diagnoses include: Risperdal a few times, she was afraid to take it. Wellbutrin, Buspar, Seroquel for sleep and AH caused too much drowsiness   Never hosp for psych. No h/o self-harm  Allergies: Amoxicillin-pot clavulanate  Current Medications:  Current Outpatient Medications:    busPIRone (BUSPAR) 15 MG tablet, Take 10 mg by mouth daily., Disp: , Rfl:    clobetasol ointment (TEMOVATE) 0.05 %, Apply 1 Application topically 2 (two) times daily., Disp: , Rfl:    estradiol (ESTRACE) 0.1 MG/GM vaginal cream, Place 1  Applicatorful vaginally at bedtime., Disp: , Rfl:    famotidine (PEPCID) 20 MG tablet, Take 20 mg by mouth 2 (two) times daily., Disp: , Rfl:    ketotifen (ZADITOR) 0.035 % ophthalmic solution, 1 drop in the morning and at bedtime., Disp: , Rfl:    levothyroxine (SYNTHROID) 112 MCG tablet, Take 112 mcg by mouth daily before breakfast., Disp: , Rfl:    lisinopril (ZESTRIL) 5 MG tablet, Take 5 mg by mouth daily., Disp: , Rfl:    triamcinolone (KENALOG) 0.025 % ointment, Apply 1 Application topically 2 (two) times daily., Disp: , Rfl:    Triamcinolone Acetonide (NASACORT AQ NA), Place into the nose., Disp: , Rfl:    VITAMIN D PO, Take by mouth., Disp: , Rfl:    buPROPion (WELLBUTRIN XL) 150 MG 24 hr tablet, Take 1 tablet (150 mg total) by mouth daily., Disp: 90 tablet, Rfl: 1   Cyanocobalamin (VITAMIN B 12 PO), Take by mouth. (Patient not taking: Reported on 01/13/2024), Disp: , Rfl:    finasteride (PROSCAR) 5 MG tablet, Take 5 mg by mouth daily. (Patient not taking: Reported on 01/13/2024), Disp: , Rfl:  Medication Side Effects: hypersomnolence  Family Medical/ Social History: Changes? No  MENTAL HEALTH EXAM:  There were no vitals taken for this visit.There is no height or weight on file to calculate BMI.  General Appearance: Casual and Well Groomed  Eye Contact:  Good  Speech:  Clear and Coherent and Normal Rate  Volume:  Normal  Mood:  Euthymic  Affect:  Congruent  Thought Process:  Goal Directed and Descriptions of Associations: Circumstantial  Orientation:  Full (Time, Place, and Person)  Thought Content: Logical   Suicidal  Thoughts:  No  Homicidal Thoughts:  No  Memory:  WNL  Judgement:  Good  Insight:  Good  Psychomotor Activity:  Normal  Concentration:  Concentration: Good  Recall:  Good  Fund of Knowledge: Good  Language: Good  Assets:  Communication Skills Desire for Improvement Financial Resources/Insurance Housing Transportation  ADL's:  Intact  Cognition: WNL   Prognosis:  Good   DIAGNOSES:    ICD-10-CM   1. Situational mixed anxiety and depressive disorder  F43.23     2. Auditory hallucinations  R44.0     3. Paranoia [facility])  F22       Receiving Psychotherapy: Yes   with Lieutenant Diego, [facility].  RECOMMENDATIONS:   PDMP reviewed.  Ativan filled 05/12/2023. I provided 20 minutes of face to face time during this encounter, including time spent before and after the visit in records review, medical decision making, counseling pertinent to today's visit, and charting.   The paranoia and hallucinations have resolved and I think stress related.  If they recur, will need to address with another AP since Seroquel caused extreme sedation.   Continue Wellbutrin XL 150 mg, 1 q am. Continue Buspar 15 mg, 2/3 (10 mg every day.) Continue vitamins per med list.  Return in 6-8 weeks.  Melony Overly, PA-C

## 2024-01-15 ENCOUNTER — Ambulatory Visit: Payer: BC Managed Care – PPO | Admitting: Professional Counselor

## 2024-01-19 ENCOUNTER — Ambulatory Visit: Admitting: Professional Counselor

## 2024-01-19 ENCOUNTER — Encounter: Payer: Self-pay | Admitting: Professional Counselor

## 2024-01-19 DIAGNOSIS — F22 Delusional disorders: Secondary | ICD-10-CM | POA: Diagnosis not present

## 2024-01-19 DIAGNOSIS — F331 Major depressive disorder, recurrent, moderate: Secondary | ICD-10-CM | POA: Diagnosis not present

## 2024-01-19 NOTE — Progress Notes (Signed)
      [facility] Counselor/Therapist Progress Note  Patient ID: Shari Barker, MRN: [id number],    Date: 01/19/2024  Time Spent: 3:14 PM to 4:17 PM  Treatment Type: Individual Therapy  Reported Symptoms: Increased sleep, low motivation, worries, stress, low mood, sense of overwhelm, tearfulness, intrusive delusional thoughts of a persecutory nature and related to auditory hallucinations  Mental Status Exam:  Appearance:   Casual     Behavior:  Appropriate, Sharing, and Motivated  Motor:  Normal  Speech/Language:   Clear and Coherent and Normal Rate  Affect:  Tearful  Mood:  depressed and sad  Thought process:  normal  Thought content:    WNL  Sensory/Perceptual disturbances:    WNL  Orientation:  oriented to person, place, time/date, and situation  Attention:  Good  Concentration:  Good  Memory:  WNL  Fund of knowledge:   Good  Insight:    Good  Judgment:   Good  Impulse Control:  Good   Risk Assessment: Danger to Self:  No Self-injurious Behavior: No Danger to Others: No Duty to Warn:no Physical Aggression / Violence:No  Access to Firearms a concern: No  Gang Involvement:No   Subjective: Patient presented to session to address concerns of depression and delusions.  She reported mixed progress at this time.  Counselor facilitated PHQ-9 with a score of 17 and GAD-7 with a score 5.  Counselor and patient discussed results, and patient medications.  She reported to be taking half dose of the prescription because of her increase of sleep as a problematic side effect.  Counselor encouraged follow-up with prescribing provider.  Patient voiced job search to be very difficult and stressful, and to be having interviews and having been to a resume writing class.  Counselor affirmed patient proactive efforts.  Patient also reported working on estate issues, and processed frustrations therein.  She reported having ongoing symptoms, but also progress in having been mindful and  working to reframe intrusive thoughts of a persecutory nature go per CBT skill set.  She reported auditory hallucinations related to these thoughts.  She voiced progress in that she only had 1 such episode since last session.  Counselor affirmed patient progress and reinforced patient coping skills, and utilized MI to encourage behavioral activation.  Counselor helped patient identify short-term goal between sessions.  Interventions: Assertiveness/Communication, Motivational Interviewing, Solution-Oriented/Positive Psychology, Humanistic/Existential, and Insight-Oriented  Diagnosis:   ICD-10-CM   1. Major depressive disorder, recurrent episode, moderate (HCC)  F33.1     2. Delusional disorder, persecutory type, continuous (HCC)  F22       Plan: Patient is scheduled for follow-up; continue process work and developing coping skills.  Patient identified short-term goal between sessions to work on tasks she is avoiding that she identifies would be helpful to her wellbeing.  Progress note was dictated with Dragon and reviewed for accuracy.  Gaspar Bidding, [facility]

## 2024-02-16 ENCOUNTER — Ambulatory Visit: Payer: BC Managed Care – PPO | Admitting: Professional Counselor

## 2024-03-02 ENCOUNTER — Encounter: Payer: Self-pay | Admitting: Professional Counselor

## 2024-03-02 ENCOUNTER — Ambulatory Visit: Admitting: Professional Counselor

## 2024-03-02 DIAGNOSIS — F22 Delusional disorders: Secondary | ICD-10-CM | POA: Diagnosis not present

## 2024-03-02 DIAGNOSIS — F331 Major depressive disorder, recurrent, moderate: Secondary | ICD-10-CM | POA: Diagnosis not present

## 2024-03-02 NOTE — Progress Notes (Signed)
      [facility] Counselor/Therapist Progress Note  Patient ID: Shari Barker, MRN: [id number],    Date: 03/02/2024  Time Spent: 3:15 PM to 4:13 PM  Treatment Type: Individual Therapy  Reported Symptoms: preoccupying thoughts, stress, low mood, anhedonia, low self-esteem, worries, persecutory voices and ideation, excess sleep, fatigue  Mental Status Exam:  Appearance:   Casual     Behavior:  Appropriate, Sharing, and Motivated  Motor:  Normal  Speech/Language:   Clear and Coherent and Normal Rate  Affect:  Appropriate and Congruent  Mood:  normal  Thought process:  normal  Thought content:    WNL  Sensory/Perceptual disturbances:    WNL  Orientation:  oriented to person, place, time/date, and situation  Attention:  Good  Concentration:  Good  Memory:  WNL  Fund of knowledge:   Good  Insight:    Good  Judgment:   Good  Impulse Control:  Good   Risk Assessment: Danger to Self:  No Self-injurious Behavior: No Danger to Others: No Duty to Warn:no Physical Aggression / Violence:No  Access to Firearms a concern: No  Gang Involvement:No   Subjective: Patient presented to session to address concerns of depression and delusional disorder.  She reported mixed progress at this time.  She reported low motivation around self-care including household tasks, and discouragement around still not finding employment since last session, in spite of efforts.  Patient processed experience of delusional persecutory episode at a store and identified how she coped, and general experience of hearing voices of other people as persecutory in nature, towards her.  Counselor assisted patient in cognitive restructuring per CBT, and reinforced patient resourcing of positive self talk and spirituality.  Patient reported progress in selling of inherited home, and identified related goals.  Counselor facilitated narrative therapy intervention where patient identified grief/loss, conflicts in life,  traumas, times of confusion and memory loss, and positive influences and experiences, and hopes and dreams for the future.  Patient reflected on narrative timeline and discussed impact with counselor.  Counselor help facilitate insight and develop meaning making, and identification of patient strengths.  Interventions: Solution-Oriented/Positive Psychology, Humanistic/Existential, Narrative, and Insight-Oriented, CBT  Diagnosis:   ICD-10-CM   1. Major depressive disorder, recurrent episode, moderate (HCC)  F33.1     2. Delusional disorder, persecutory type, continuous (HCC)  F22       Plan: Patient is scheduled for follow-up; continue process work and developing coping skills.  Patient short-term goal between sessions to continue to utilize CBT cognitive restructuring to mitigate symptomology, worked on cleaning out sheds per personal goal, continue to resource positive self talk and spirituality as coping mechanisms.  Progress note was dictated with Dragon and reviewed for accuracy.  Anthon Kins, [facility]

## 2024-03-04 ENCOUNTER — Ambulatory Visit: Admitting: Physician Assistant

## 2024-03-04 ENCOUNTER — Encounter: Payer: Self-pay | Admitting: Physician Assistant

## 2024-03-04 DIAGNOSIS — F411 Generalized anxiety disorder: Secondary | ICD-10-CM

## 2024-03-04 DIAGNOSIS — G4700 Insomnia, unspecified: Secondary | ICD-10-CM

## 2024-03-04 DIAGNOSIS — F3341 Major depressive disorder, recurrent, in partial remission: Secondary | ICD-10-CM | POA: Diagnosis not present

## 2024-03-04 MED ORDER — QUETIAPINE FUMARATE 25 MG PO TABS: 25.0000 mg | ORAL_TABLET | Freq: Every day | ORAL | 1 refills | Status: DC

## 2024-03-04 NOTE — Progress Notes (Signed)
 [facility] Med Check  Patient ID: Shari Barker,  MRN: [id number]  PCP: Shari Splinter, PA-C  Date of Evaluation: 03/04/2024 Time spent:20 minutes  Chief Complaint:  Chief Complaint   Anxiety; Depression; Insomnia; Follow-up    HISTORY/CURRENT STATUS: HPI For 6 week med check.   [name] under less stress than she has been. Still unemployed.  Plans to work around the house for the next month and then will start re-looking for a new job. Anxiety isn't as bad. No PA.   Patient is able to enjoy things.  Energy and motivation are good.  No extreme sadness, tearfulness, or feelings of hopelessness.  Sleeps well most of the time.  She wasn't able to tolerate Seroquel  XR 150 mg so she decreased back to 25 mg at bedtime. ADLs and personal hygiene are normal.   Denies any changes in concentration, making decisions, or remembering things.  Appetite has not changed.  Weight is stable.   Denies suicidal or homicidal thoughts.  Patient denies increased energy with decreased need for sleep, increased talkativeness, racing thoughts, impulsivity or risky behaviors, increased spending, increased libido, grandiosity, increased irritability or anger, paranoia, or hallucinations.  Denies dizziness, syncope, seizures, numbness, tingling, tremor, tics, unsteady gait, slurred speech, confusion. Denies muscle or joint pain, stiffness, or dystonia.  Individual Medical History/ Review of Systems: Changes? :No   Past medications for mental health diagnoses include: Risperdal a few times, she was afraid to take it. Wellbutrin , Buspar, Seroquel  for sleep and AH caused too much drowsiness   Never hosp for psych. No h/o self-harm  Allergies: Amoxicillin-pot clavulanate  Current Medications:  Current Outpatient Medications:    buPROPion  (WELLBUTRIN  XL) 150 MG 24 hr tablet, Take 1 tablet (150 mg total) by mouth daily., Disp: 90 tablet, Rfl: 1   busPIRone (BUSPAR) 15 MG tablet, Take 10 mg by mouth  daily., Disp: , Rfl:    Cyanocobalamin (VITAMIN B 12 PO), Take by mouth., Disp: , Rfl:    estradiol (ESTRACE) 0.1 MG/GM vaginal cream, Place 1 Applicatorful vaginally at bedtime., Disp: , Rfl:    famotidine (PEPCID) 20 MG tablet, Take 20 mg by mouth 2 (two) times daily., Disp: , Rfl:    ketotifen (ZADITOR) 0.035 % ophthalmic solution, 1 drop in the morning and at bedtime., Disp: , Rfl:    levothyroxine (SYNTHROID) 112 MCG tablet, Take 112 mcg by mouth daily before breakfast., Disp: , Rfl:    lisinopril (ZESTRIL) 5 MG tablet, Take 5 mg by mouth daily., Disp: , Rfl:    triamcinolone (KENALOG) 0.025 % ointment, Apply 1 Application topically 2 (two) times daily., Disp: , Rfl:    VITAMIN D PO, Take by mouth., Disp: , Rfl:    clobetasol ointment (TEMOVATE) 0.05 %, Apply 1 Application topically 2 (two) times daily., Disp: , Rfl:    finasteride (PROSCAR) 5 MG tablet, Take 5 mg by mouth daily. (Patient not taking: Reported on 03/04/2024), Disp: , Rfl:    QUEtiapine  (SEROQUEL ) 25 MG tablet, Take 1 tablet (25 mg total) by mouth at bedtime., Disp: 90 tablet, Rfl: 1   Triamcinolone Acetonide (NASACORT AQ NA), Place into the nose. (Patient not taking: Reported on 03/04/2024), Disp: , Rfl:  Medication Side Effects: hypersomnolence  Family Medical/ Social History: Changes? No  MENTAL HEALTH EXAM:  There were no vitals taken for this visit.There is no height or weight on file to calculate BMI.  General Appearance: Casual and Well Groomed  Eye Contact:  Good  Speech:  Clear and Coherent  and Normal Rate  Volume:  Normal  Mood:  Euthymic  Affect:  Congruent  Thought Process:  Goal Directed and Descriptions of Associations: Circumstantial  Orientation:  Full (Time, Place, and Person)  Thought Content: Logical   Suicidal Thoughts:  No  Homicidal Thoughts:  No  Memory:  WNL  Judgement:  Good  Insight:  Good  Psychomotor Activity:  Normal  Concentration:  Concentration: Good  Recall:  Good  Fund of  Knowledge: Good  Language: Good  Assets:  Communication Skills Desire for Improvement Financial Resources/Insurance Housing Resilience Transportation  ADL's:  Intact  Cognition: WNL  Prognosis:  Good   DIAGNOSES:    ICD-10-CM   1. 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 Generalized anxiety disorder  F41.1     3. Insomnia, unspecified type  G47.00       Receiving Psychotherapy: Yes   with Shari Barker, [facility].  RECOMMENDATIONS:   PDMP reviewed.  Ativan filled 05/12/2023. I provided 20 minutes of face to face time during this encounter, including time spent before and after the visit in records review, medical decision making, counseling pertinent to today's visit, and charting.   [name] doing well w/ current meds so no changes are needed.   Continue Wellbutrin  XL 150 mg, 1 q am. Continue Buspar 15 mg,1 daily. Continue seroquel  25 mg, 1 at bedtime. Continue vitamins per med list.  Return in 4 months.  Shari Slocumb, PA-C

## 2024-03-27 ENCOUNTER — Other Ambulatory Visit: Payer: Self-pay | Admitting: Physician Assistant

## 2024-03-30 ENCOUNTER — Ambulatory Visit: Admitting: Professional Counselor

## 2024-04-22 ENCOUNTER — Encounter: Payer: Self-pay | Admitting: Professional Counselor

## 2024-04-22 ENCOUNTER — Ambulatory Visit: Admitting: Professional Counselor

## 2024-04-22 DIAGNOSIS — F331 Major depressive disorder, recurrent, moderate: Secondary | ICD-10-CM | POA: Diagnosis not present

## 2024-04-22 DIAGNOSIS — F411 Generalized anxiety disorder: Secondary | ICD-10-CM

## 2024-04-22 DIAGNOSIS — F22 Delusional disorders: Secondary | ICD-10-CM | POA: Diagnosis not present

## 2024-04-22 NOTE — Progress Notes (Signed)
      [facility] Counselor/Therapist Progress Note  Patient ID: Shari Barker, MRN: [id number],    Date: 04/22/2024  Time Spent: 4:11 PM - 5:10 PM   Treatment Type: Individual Therapy  Reported Symptoms: sadness, worries, tearfulness, frustration, irritability, fatigue, sense of overwhelm, stress, low trust, low motivation, increased eating, excess sleep, sense of purposelessness, mild paranoid ideation, career concerns  Mental Status Exam:  Appearance:   Casual     Behavior:  Appropriate and Sharing  Motor:  Normal  Speech/Language:   Clear and Coherent and Normal Rate  Affect:  Depressed and Tearful  Mood:  depressed and sad  Thought process:  normal  Thought content:    WNL  Sensory/Perceptual disturbances:    WNL  Orientation:  oriented to person, place, time/date, and situation  Attention:  Good  Concentration:  Good  Memory:  WNL  Fund of knowledge:   Good  Insight:    Good  Judgment:   Good  Impulse Control:  Good   Risk Assessment: Danger to Self:  No Self-injurious Behavior: No Danger to Others: No Duty to Warn:no Physical Aggression / Violence:No  Access to Firearms a concern: No  Gang Involvement:No   Subjective: Patient presented to session to address concerns of depression, anxiety and paranoia.  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  She reported having taken the month of May off from many responsibilities to focus on this task however experiencing stress about needing work.  She reported having been applying for 3 jobs a week.  Counselor and patient discussed patient career options including possibility of medical/legal transcription, ER administration work, temp work, Engineer, manufacturing work.  They discussed patient possibility of looking for a home church, and/or getting a pet for companionship and comfort.  They discussed structure and purpose to having  a job as being helpful, when the time comes.  Patient identified prayer life as primary to her coping skill set.  Interventions: Solution-Oriented/Positive Psychology, Humanistic/Existential, Insight-Oriented, and Resourcing  Diagnosis:   ICD-10-CM   1. Major depressive disorder, recurrent episode, moderate (HCC)  F33.1     2. Generalized anxiety disorder  F41.1     3. Delusional disorder, persecutory type, continuous (HCC)  F22       Plan: Pt is scheduled for a follow-up; continue process work and developing coping skills. STG between sessions to attend church for social outlet and support, resource prayer for coping, possibly get a dog for emotional support, continue applying for jobs, and continue practice of CBT skillset as needed.  Shari Barker, [facility]

## 2024-05-31 ENCOUNTER — Encounter: Payer: Self-pay | Admitting: Professional Counselor

## 2024-05-31 ENCOUNTER — Ambulatory Visit: Admitting: Professional Counselor

## 2024-05-31 DIAGNOSIS — F331 Major depressive disorder, recurrent, moderate: Secondary | ICD-10-CM

## 2024-05-31 DIAGNOSIS — F411 Generalized anxiety disorder: Secondary | ICD-10-CM | POA: Diagnosis not present

## 2024-05-31 NOTE — Progress Notes (Signed)
      [facility] Counselor/Therapist Progress Note  Patient ID: COLLIE KITTEL, MRN: [id number],    Date: 05/31/2024  Time Spent: 4:04 PM - 5:06 PM   Treatment Type: Individual Therapy  Reported Symptoms: sadness, worries, frustration, tearfulness, social isolation, fatigue, anhedonia, low mood, catastrophic thinking, phase of life concerns, career concerns, low self esteem, nervousness, anxiousness, interpersonal concerns  Mental Status Exam:  Appearance:   Casual     Behavior:  Appropriate and Sharing  Motor:  Normal  Speech/Language:   Clear and Coherent and Normal Rate  Affect:  Depressed and Tearful  Mood:  depressed and sad  Thought process:  normal  Thought content:    WNL  Sensory/Perceptual disturbances:    WNL  Orientation:  oriented to person, place, time/date, and situation  Attention:  Good  Concentration:  Good  Memory:  WNL  Fund of knowledge:   Good  Insight:    Good  Judgment:   Good  Impulse Control:  Good   Risk Assessment: Danger to Self:  No Self-injurious Behavior: No Danger to Others: No Duty to Warn:no Physical Aggression / Violence:No  Access to Firearms a concern: No  Gang Involvement:No   Subjective: Patient presented to session to address concerns of depression and anxiety.  She reported minimal progress at this time.  She processed experience of frustration and worry around not being able to get a job in spite of interviews.  She identified having been out of work for 6 months.  She voiced gratitude that her son has been working, will be a Holiday representative in the upcoming school year, and is working on his [employer].  Counselor assisted patient in resourcing hope and positivity, and helped encouraged patient motivation regarding continued proactive efforts.  Counselor and patient discussed patient possible skill building such as Sales executive, skill set opportunities such as medical transcription, possibility of volunteer work for  experience, references, and sense of fulfillment and implementation of structure.  Counselor encouraged patient continued resourcing of her spirituality and encouraged patient connecting with church community for increased social support.  Interventions: Solution-Oriented/Positive Psychology, Humanistic/Existential, Insight-Oriented, and Resourcing, Career Counseling  Diagnosis:   ICD-10-CM   1. Major depressive disorder, recurrent episode, moderate (HCC)  F33.1     2. Generalized anxiety disorder  F41.1       Plan: Pt is scheduled for a follow-up; continue process work and developing coping skills. STG between sessions for pt to continue to apply to jobs, consider church visits to find community, consider volunteer work while unemployed to boost resume, experience and Personnel officer. Continue faith practices as healthy coping mechanism.  [name], [facility]

## 2024-06-21 ENCOUNTER — Ambulatory Visit (INDEPENDENT_AMBULATORY_CARE_PROVIDER_SITE_OTHER): Payer: Self-pay | Admitting: Professional Counselor

## 2024-06-21 ENCOUNTER — Encounter: Payer: Self-pay | Admitting: Professional Counselor

## 2024-06-21 DIAGNOSIS — F411 Generalized anxiety disorder: Secondary | ICD-10-CM | POA: Diagnosis not present

## 2024-06-21 DIAGNOSIS — F22 Delusional disorders: Secondary | ICD-10-CM | POA: Diagnosis not present

## 2024-06-21 DIAGNOSIS — F331 Major depressive disorder, recurrent, moderate: Secondary | ICD-10-CM | POA: Diagnosis not present

## 2024-06-21 NOTE — Progress Notes (Unsigned)
      [facility] Counselor/Therapist Progress Note  Patient ID: Shari Barker, MRN: [id number],    Date: 06/21/2024  Time Spent: 5:17 PM to 6:20 PM  Treatment Type: Individual Therapy  Reported Symptoms: Tearfulness, stress, health concerns, worries, restlessness, nervousness, sadness, low mood, anhedonia, motivational concerns, career concerns, phase of life concerns, health concerns, intermittent paranoia  Mental Status Exam:  Appearance:   Casual     Behavior:  Appropriate, Sharing, and Motivated  Motor:  Normal  Speech/Language:   Clear and Coherent and Normal Rate  Affect:  Tearful  Mood:  worried  Thought process:  normal  Thought content:    WNL  Sensory/Perceptual disturbances:    WNL  Orientation:  oriented to person, place, time/date, and situation  Attention:  Good  Concentration:  Good  Memory:  WNL  Fund of knowledge:   Good  Insight:    Good  Judgment:   Good  Impulse Control:  Good   Risk Assessment: Danger to Self:  No Self-injurious Behavior: No Danger to Others: No Duty to Warn:no Physical Aggression / Violence:No  Access to Firearms a concern: No  Gang Involvement:No   Subjective: Patient presented to session to address concerns of anxiety, depression and paranoia.  Patient identified mixed progress at this time.  She reported issue with family estate to becoming too close for which she is grateful.  However she voiced exacerbated health concerns related to a high A1c and weight gain, and continuing to have difficulty in finding a job.  She processed experience of challenging job interviews and a sense of low self-esteem, and intermittent paranoid episodes.  She voiced gratitude that her son is doing well at this time.  Counselor actively listened, affirmed patient feelings and experience, and help patient in resourcing her sense of her value and strengths, and helped with strategies for participating in job interviews.  Counselor help patient  identify short-term goals to help her with self-care and self-confidence.  Patient identified having goals of exercise, painting by numbers, reading or the Bible, getting a job, and practicing typing.  She also identified possibility of getting a puppy for emotional support.  Counselor affirmed and reinforced patient proactive efforts for her wellbeing.  Interventions: Solution-Oriented/Positive Psychology, Humanistic/Existential, Insight-Oriented, and Goal Planning, Resourcing   Diagnosis:   ICD-10-CM   1. Major depressive disorder, recurrent episode, moderate (HCC)  F33.1     2. Generalized anxiety disorder  F41.1     3. Delusional disorder, persecutory type, continuous (HCC)  F22       Plan: Patient is scheduled for follow-up; continue process work and developing coping skills.  STG between sessions for patient to prioritize self-care and resourcing for social, spiritual, creative, and health enrichment as discussed in session.  [name], [facility]

## 2024-07-06 ENCOUNTER — Ambulatory Visit (INDEPENDENT_AMBULATORY_CARE_PROVIDER_SITE_OTHER): Admitting: Physician Assistant

## 2024-07-06 DIAGNOSIS — F3341 Major depressive disorder, recurrent, in partial remission: Secondary | ICD-10-CM | POA: Diagnosis not present

## 2024-07-06 DIAGNOSIS — F411 Generalized anxiety disorder: Secondary | ICD-10-CM | POA: Diagnosis not present

## 2024-07-06 DIAGNOSIS — Z560 Unemployment, unspecified: Secondary | ICD-10-CM

## 2024-07-07 ENCOUNTER — Telehealth: Payer: Self-pay | Admitting: Physician Assistant

## 2024-07-07 NOTE — Telephone Encounter (Signed)
 Pt had apt 9/2 and provider advised to take 2 tablets Bupropion  150 mg daily until gone. Pt called to verify if she needs to take both (2) tablets 150 mg together? Contact pt @ [phone]

## 2024-07-07 NOTE — Telephone Encounter (Signed)
 Told patient to take bupropion  together, if she took too late in the day she may have trouble sleeping.

## 2024-07-20 ENCOUNTER — Encounter: Payer: Self-pay | Admitting: Professional Counselor

## 2024-07-20 ENCOUNTER — Ambulatory Visit: Admitting: Professional Counselor

## 2024-07-20 DIAGNOSIS — F411 Generalized anxiety disorder: Secondary | ICD-10-CM | POA: Diagnosis not present

## 2024-07-20 DIAGNOSIS — F331 Major depressive disorder, recurrent, moderate: Secondary | ICD-10-CM

## 2024-07-20 NOTE — Progress Notes (Signed)
      [facility] Counselor/Therapist Progress Note  Patient ID: Shari Barker, MRN: [id number],    Date: 07/20/2024  Time Spent: 9:14 AM to 10:14 AM  Treatment Type: Individual Therapy  Reported Symptoms: Sadness, worries, loneliness, anxiousness, low mood, anhedonia, self-esteem concerns, stress, phase of life concerns, parenting concerns, career concerns  Mental Status Exam:  Appearance:   Casual     Behavior:  Appropriate, Sharing, and Motivated  Motor:  Normal  Speech/Language:   Clear and Coherent and Normal Rate  Affect:  Appropriate and Congruent  Mood:  normal  Thought process:  normal  Thought content:    WNL  Sensory/Perceptual disturbances:    WNL  Orientation:  oriented to person, place, time/date, and situation  Attention:  Good  Concentration:  Good  Memory:  WNL  Fund of knowledge:   Good  Insight:    Good  Judgment:   Good  Impulse Control:  Good   Risk Assessment: Danger to Self:  No Self-injurious Behavior: No Danger to Others: No Duty to Warn:no Physical Aggression / Violence:No  Access to Firearms a concern: No  Gang Involvement:No   Subjective: Patient presented to session to address concerns of anxiety and depression.  She reported makes progress at this time.  Patient reported having gotten on Medicaid and for this to help tremendously with her experience of financial stress.  She reported having completed several certifications to enhance her skill set for her resume.  She reported looking into getting a dog for emotional support.  She reported to have states settled in for this to be a huge relief.  She processed her feelings and experience around her sons developmental phase impact.  Patient identified concern for her health, while also identifying being proactive about walking and losing weight.  She processed experience of preoccupying thoughts and fears around state of the world and politics.  Counselor actively listened, affirmed patient  feelings and experience, reinforced patient positivity and proactive efforts for self-care, helped patient to identify coping skills to limit exposure to hurtful media content, and identify adaptive coping skills including nurturing her spirituality.  Interventions: Solution-Oriented/Positive Psychology, Humanistic/Existential, and Insight-Oriented  Diagnosis:   ICD-10-CM   1. Major depressive disorder, recurrent episode, moderate (HCC)  F33.1     2. Generalized anxiety disorder  F41.1       Plan: Patient is scheduled for follow-up; continue process work and developing coping skills.  Patient short-term goal between sessions to continue with healthy self-care efforts, and limit social media and Internet exposure, exercising mindfulness around what she invites into her consciousness as relates to her spiritual orientation and sense of nourishment.  Shari Barker, [facility]

## 2024-07-25 ENCOUNTER — Encounter: Payer: Self-pay | Admitting: Physician Assistant

## 2024-07-25 NOTE — Progress Notes (Signed)
 [facility]  Patient ID: Shari Barker,  MRN: [id number]  PCP: Sharl Tully HERO, PA-C  Date of Evaluation: 07/06/2024 Time spent:20 minutes  Chief Complaint:  Chief Complaint   Follow-up    HISTORY/CURRENT STATUS: HPI For routine 4 month med check.   Shari Barker is still looking for employment.  9 months since she's been out of work. Uncertain about the future, sometimes she has days she doesn't want to do anything but pushes herself.  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 Thinking about getting a dog b/c she's home alone all the time since her son is in school. ADLs nl.  Hygiene is nl. Appetite is stable. Energy and motivation are stable. Sleeps ok.  Anxiety is controlled.  No delirium, psychosis, mania, SI/HI.  Individual Medical History/ Review of Systems: Changes? :No   Past medications for mental health diagnoses include: Risperdal a few times, she was afraid to take it. Wellbutrin , Buspar, Seroquel  for sleep and AH caused too much drowsiness   Never hosp for psych. No h/o self-harm  Allergies: Amoxicillin-pot clavulanate  Current Medications:  Current Outpatient Medications:    buPROPion  (WELLBUTRIN  XL) 150 MG 24 hr tablet, Take 1 tablet (150 mg total) by mouth daily., Disp: 90 tablet, Rfl: 1   busPIRone (BUSPAR) 15 MG tablet, Take 10 mg by mouth daily., Disp: , Rfl:    clobetasol ointment (TEMOVATE) 0.05 %, Apply 1 Application topically 2 (two) times daily., Disp: , Rfl:    Cyanocobalamin (VITAMIN B 12 PO), Take by mouth., Disp: , Rfl:    estradiol (ESTRACE) 0.1 MG/GM vaginal cream, Place 1 Applicatorful vaginally at bedtime., Disp: , Rfl:    famotidine (PEPCID) 20 MG tablet, Take 20 mg by mouth 2 (two) times daily., Disp: , Rfl:    ketotifen (ZADITOR) 0.035 % ophthalmic solution, 1 drop in the morning and at bedtime., Disp: , Rfl:    levothyroxine (SYNTHROID) 112 MCG tablet, Take 112 mcg by mouth  daily before breakfast., Disp: , Rfl:    lisinopril (ZESTRIL) 5 MG tablet, Take 5 mg by mouth daily., Disp: , Rfl:    QUEtiapine  (SEROQUEL ) 25 MG tablet, Take 1 tablet (25 mg total) by mouth at bedtime., Disp: 90 tablet, Rfl: 1   triamcinolone (KENALOG) 0.025 % ointment, Apply 1 Application topically 2 (two) times daily., Disp: , Rfl:    Triamcinolone Acetonide (NASACORT AQ NA), Place into the nose., Disp: , Rfl:    VITAMIN D PO, Take by mouth., Disp: , Rfl:    finasteride (PROSCAR) 5 MG tablet, Take 5 mg by mouth daily. (Patient not taking: Reported on 03/04/2024), Disp: , Rfl:  Medication Side Effects: hypersomnolence  Family Medical/ Social History: Changes? No  MENTAL HEALTH EXAM:  There were no vitals taken for this visit.There is no height or weight on file to calculate BMI.  General Appearance: Casual and Well Groomed  Eye Contact:  Good  Speech:  Clear and Coherent and Normal Rate  Volume:  Normal  Mood:  Euthymic  Affect:  Congruent  Thought Process:  Goal Directed and Descriptions of Associations: Circumstantial  Orientation:  Full (Time, Place, and Person)  Thought Content: Logical   Suicidal Thoughts:  No  Homicidal Thoughts:  No  Memory:  WNL  Judgement:  Good  Insight:  Good  Psychomotor Activity:  Normal  Concentration:  Concentration: Good  Recall:  Good  Fund of Knowledge: Good  Language: Good  Assets:  Desire for Improvement Financial Resources/Insurance Housing Resilience Transportation  ADL's:  Intact  Cognition: WNL  Prognosis:  Good   DIAGNOSES:    ICD-10-CM   1. 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 Generalized anxiety disorder  F41.1     3. Unemployed  Z56.0       Receiving Psychotherapy: Yes   with Cato Sprang, [facility].  RECOMMENDATIONS:   PDMP reviewed.  Ativan filled 05/12/2023. I provided 20 minutes of face to face time during this encounter, including time spent before and after the visit in records  review, medical decision making, counseling pertinent to today's visit, and charting.   [name] doing well w/ current meds so no changes are needed.   Continue Wellbutrin  XL 150 mg, 1 q am. Continue Buspar 15 mg,1 daily. Continue seroquel  25 mg, 1 at bedtime. Continue vitamins per med list.  Return in 3-4 months.'  Verneita Cooks, PA-C

## 2024-08-17 ENCOUNTER — Encounter: Payer: Self-pay | Admitting: Professional Counselor

## 2024-08-17 ENCOUNTER — Ambulatory Visit: Admitting: Professional Counselor

## 2024-08-17 DIAGNOSIS — F22 Delusional disorders: Secondary | ICD-10-CM

## 2024-08-17 DIAGNOSIS — F331 Major depressive disorder, recurrent, moderate: Secondary | ICD-10-CM | POA: Diagnosis not present

## 2024-08-17 DIAGNOSIS — F411 Generalized anxiety disorder: Secondary | ICD-10-CM | POA: Diagnosis not present

## 2024-08-17 NOTE — Progress Notes (Signed)
      [facility] Counselor/Therapist Progress Note  Patient ID: Shari Barker, MRN: [id number],    Date: 08/17/2024  Time Spent: 10:15 AM to 11:12 AM  Treatment Type: Individual Therapy  Reported Symptoms: Tearfulness, anxiousness, delusional thought patterns, irritability, worries, low mood, motivational concerns, fatigue, phase of life concerns, financial concerns  Mental Status Exam:  Appearance:   Casual     Behavior:  Appropriate, Sharing, and Motivated  Motor:  Normal  Speech/Language:   Clear and Coherent and Normal Rate  Affect:  Appropriate, Congruent, and Tearful  Mood:  sad  Thought process:  normal  Thought content:    WNL  Sensory/Perceptual disturbances:    WNL  Orientation:  oriented to person, place, time/date, and situation  Attention:  Good  Concentration:  Good  Memory:  WNL  Fund of knowledge:   Good  Insight:    Good  Judgment:   Good  Impulse Control:  Good   Risk Assessment: Danger to Self:  No Self-injurious Behavior: No Danger to Others: No Duty to Warn:no Physical Aggression / Violence:No  Access to Firearms a concern: No  Gang Involvement:No   Subjective: Patient presented to session to address concerns of depression, anxiety and delusional thought patterns.  She reported mixed progress at this time.  She reported to have gone live on [employer] and to be working to sell belongings.  She processed thoughts regarding filing for disability, with her attempts to find a job to feel continually thwarted.  She reported having called a friend and taking a walk recently as helpful outlets.  Counselor reinforced patient proactive efforts and use of healthy coping mechanisms.  Patient processed experience of paranoid episode and sense of her antenna always being up around circumstances of concern.  Counselor assisted patient in resourcing cognitive restructuring around episode and related historical events and traumas.  Counselor also assisted patient  with ideas for job search including attending job fairs and/or career development community resources.  Interventions: Cognitive Behavioral Therapy, Solution-Oriented/Positive Psychology, Humanistic/Existential, and Insight-Oriented, Resourcing  Diagnosis:   ICD-10-CM   1. Major depressive disorder, recurrent episode, moderate (HCC)  F33.1     2. Delusional disorder, persecutory type, continuous (HCC)  F22     3. Generalized anxiety disorder  F41.1       Plan: Patient is scheduled for follow-up; continue process work and developing coping skills.  Patient short-term goal between sessions to utilize cognitive restructuring around triggering events for delusional thought patterns, continue practicing healthy coping mechanisms such as socialization and movement, consider attending job fears and/or resourcing career development community opportunities.  Shari Barker, [facility]

## 2024-09-01 ENCOUNTER — Ambulatory Visit: Admitting: Physician Assistant

## 2024-09-02 ENCOUNTER — Ambulatory Visit: Admitting: Professional Counselor

## 2024-09-02 ENCOUNTER — Encounter: Payer: Self-pay | Admitting: Professional Counselor

## 2024-09-02 DIAGNOSIS — F330 Major depressive disorder, recurrent, mild: Secondary | ICD-10-CM

## 2024-09-02 DIAGNOSIS — F22 Delusional disorders: Secondary | ICD-10-CM | POA: Diagnosis not present

## 2024-09-02 DIAGNOSIS — F411 Generalized anxiety disorder: Secondary | ICD-10-CM

## 2024-09-02 NOTE — Progress Notes (Signed)
      [facility] Counselor/Therapist Progress Note  Patient ID: JASENIA WEILBACHER, MRN: [id number],    Date: 09/02/2024  Time Spent: 11:13 AM to 12:19 PM  Treatment Type: Individual Therapy  Reported Symptoms: Tearfulness, low mood, anhedonia, sadness, sense of overwhelm, worries, stress, fatigue, self-esteem concerns, catastrophic thinking, phase of life concerns, financial stress, persistent paranoid thought pattern  Mental Status Exam:  Appearance:   Casual     Behavior:  Appropriate and Sharing  Motor:  Normal  Speech/Language:   Clear and Coherent and Normal Rate  Affect:  Tearful  Mood:  Sad  Thought process:  normal  Thought content:    WNL  Sensory/Perceptual disturbances:    WNL  Orientation:  oriented to person, place, time/date, and situation  Attention:  Good  Concentration:  Good  Memory:  WNL  Fund of knowledge:   Good  Insight:    Good  Judgment:   Good  Impulse Control:  Good   Risk Assessment: Danger to Self:  No Self-injurious Behavior: No Danger to Others: No Duty to Warn:no Physical Aggression / Violence:No  Access to Firearms a concern: No  Gang Involvement:No   Subjective: Patient presented to session to address concerns of anxiety, depression, and paranoia.  Patient and counselor discussed patient's symptomology and renewing patient treatment plan.  Counselor facilitated RGPTS and patient scored 18 for part A ideas of reference and 26 for part B ideas of persecution, indicating moderate to severe, and severe, respectively.  Counselor and patient discussed recent episodes of paranoia, and related fear, and anger at herself.  They discussed patient's sense of conviction regarding her paranoid thoughts, and how patient hears things, usually people saying things related to her persecutory/conspiracy narratives.  Counselor and patient discussed unlikeliness of nature of things she hears as real, and how these negative self thoughts seem to come from  within patient and she projects into external world.  Patient voiced that she continues to try to work to deconstruct episodes, and counselor normalized how intellectualization differs from patient's felt sense of these narratives.  Counselor facilitated PHQ-9 and patient scored a 6, and GAD-7 for which patient scored an 8.  Counselor and patient discussed how PHQ-9 questionnaire does not resonate for patient in terms of depression symptoms which she expresses as manifesting as sadness, tearfulness, alongside low mood, anhedonia, fatigue, and other symptoms.  Counselor and patient discussed patient gains over course of treatment including patient ability to deconstruct paranoid episodes, and cope with anxiety and depression as well.  Patient identified staying busy with [employer], however to feel she may self sabotage.  She identified working to [employer], and still applying for jobs and feeling frustrated.  Counselor and patient discussed patient concerns and patient renewed treatment plan, to which patient gave her consent.  Interventions: Solution-Oriented/Positive Psychology, Humanistic/Existential, Insight-Oriented, and Assessments, Treatment Planning, CBT  Diagnosis:   ICD-10-CM   1. Delusional disorder, persecutory type, continuous (HCC)  F22     2. Generalized anxiety disorder  F41.1     3. Major depressive disorder, recurrent episode, mild  F33.0       Plan: Patient is scheduled for a follow-up; continue process work and developing coping skills.  Patient short-term goal between sessions to continue to work on budgeting, finding work, and practicing positive self affirmations and scientist, forensic.  [name], [facility]

## 2024-09-28 ENCOUNTER — Encounter: Payer: Self-pay | Admitting: Professional Counselor

## 2024-09-28 ENCOUNTER — Ambulatory Visit: Admitting: Professional Counselor

## 2024-09-28 DIAGNOSIS — F330 Major depressive disorder, recurrent, mild: Secondary | ICD-10-CM | POA: Diagnosis not present

## 2024-09-28 DIAGNOSIS — F22 Delusional disorders: Secondary | ICD-10-CM | POA: Diagnosis not present

## 2024-09-28 DIAGNOSIS — F411 Generalized anxiety disorder: Secondary | ICD-10-CM | POA: Diagnosis not present

## 2024-09-28 NOTE — Progress Notes (Addendum)
 [facility] Counselor/Therapist Progress Note  Patient ID: Shari Barker, MRN: [id number],    Date: 09/28/2024  Time Spent: 9:13 AM to 10:10 AM  Treatment Type: Individual Therapy  Reported Symptoms: Tearfulness, sadness, grief/loss, worries, paranoid thoughts, stress, interpersonal concerns, anxiousness, nervousness, low mood, anhedonia, sleep concerns, appetite concerns, fatigue, self-esteem concerns, irritability, catastrophic thinking, automatic negative thoughts, phase of life concerns  Mental Status Exam:  Appearance:   Casual     Behavior:  Appropriate and Sharing  Motor:  Normal  Speech/Language:   Clear and Coherent and Normal Rate  Affect:  Appropriate and Congruent, tearful  Mood:  Sad, anxious, depressed  Thought process:  normal  Thought content:    WNL  Sensory/Perceptual disturbances:    WNL  Orientation:  oriented to person, place, time/date, and situation  Attention:  Good  Concentration:  Good  Memory:  WNL  Fund of knowledge:   Good  Insight:    Good  Judgment:   Good  Impulse Control:  Good   Risk Assessment: Danger to Self:  No Self-injurious Behavior: No Danger to Others: No Duty to Warn:no Physical Aggression / Violence:No  Access to Firearms a concern: No  Gang Involvement:No   Subjective: Patient presented to session to address concerns of delusional thinking patterns, anxiety and depression.  Patient reported minimal progress at this time.  She reported exacerbation of paranoid thought process and experience, and shared with counselor regarding particularly challenging episode where she experienced considerable distress and sought support from her son who tried to help alleviate her fears.  Patient identified feeling followed, and identified having videos on her phone whereby she attempts to track narratives she is imagining unfolding in public.  She identified feeling that people are following her and tracking her behavior as a way to  both embarrass her and to protect others from what she perceives as her wrongdoing and or imperfections.  Counselor actively listened and affirmed patient feelings and experience, while also working with patient through cognitive restructuring to help patient dismantle thought patterns around idea of network of conspirators.  They discussed evidence of these interpretations versus evidence against, and discussed the deeper origins of these fears including patient fear of not doing right, having done something wrong, and people not liking her.  They discussed these wounds of the past and how early incidence in her upbringing fired/wired these associations within her.  Counselor and patient discussed ways in which patient continues to expect the worst for and of herself, including as relates self sabotage around her current efforts to sell items on [employer], and difficulty self advocating with extended family member.  Counselor helped to encourage patient sense of her strengths and self-worth, and to consider follow-up conversation to advocate for herself and her son with extended family member, working with patient on higher education careers adviser.  Interventions: Solution-Oriented/Positive Psychology, Humanistic/Existential, and Insight-Oriented, CBT, Assertiveness/Communication  Diagnosis:   ICD-10-CM   1. Delusional disorder, persecutory type, continuous (HCC)  F22     2. Generalized anxiety disorder  F41.1     3. Major depressive disorder, recurrent episode, mild  F33.0       Plan: Patient is scheduled for follow-up; continue process work and developing coping skills.  Patient short-term goal between sessions to resource cognitive restructuring skill set to mitigate delusional thinking, and practice positive self affirmations and self compassion around sense of self perception and worth.  Shari Barker, [facility]

## 2024-10-25 ENCOUNTER — Ambulatory Visit (INDEPENDENT_AMBULATORY_CARE_PROVIDER_SITE_OTHER): Admitting: Professional Counselor

## 2024-10-25 ENCOUNTER — Encounter: Payer: Self-pay | Admitting: Professional Counselor

## 2024-10-25 DIAGNOSIS — F411 Generalized anxiety disorder: Secondary | ICD-10-CM

## 2024-10-25 DIAGNOSIS — F330 Major depressive disorder, recurrent, mild: Secondary | ICD-10-CM | POA: Diagnosis not present

## 2024-10-25 DIAGNOSIS — F22 Delusional disorders: Secondary | ICD-10-CM

## 2024-10-25 NOTE — Progress Notes (Signed)
"   °      [facility] Counselor/Therapist Progress Note  Patient ID: Shari Barker, MRN: [id number],    Date: 10/25/2024  Time Spent: 9:17 AM to 10:18 AM  Treatment Type: Individual Therapy  Reported Symptoms: diminished self care, worries, anxiousness, nervousness, low mood, anhedonia, fatigue, socially isolating tendencies, automatic negative thoughts, self esteem concerns, phase of life concerns   Mental Status Exam:  Appearance:   Casual     Behavior:  Appropriate and Sharing  Motor:  Normal  Speech/Language:   Clear and Coherent and Normal Rate  Affect:  Appropriate and Congruent  Mood:  normal  Thought process:  normal  Thought content:    WNL  Sensory/Perceptual disturbances:    WNL  Orientation:  oriented to person, place, time/date, and situation  Attention:  Good  Concentration:  Good  Memory:  WNL  Fund of knowledge:   Good  Insight:    Good  Judgment:   Good  Impulse Control:  Good   Risk Assessment: Danger to Self:  No Self-injurious Behavior: No Danger to Others: No Duty to Warn:no Physical Aggression / Violence:No  Access to Firearms a concern: No  Gang Involvement:No   Subjective: Patient presented to session to address concerns of anxiety, depression and paranoia.  She reported mixed progress at this time.  She reported progress in terms of not pressuring herself with assortment of tasks.  Counselor reinforced patient proactive self-care.  Patient also reported progress in terms of not having any acute paranoia episodes, however identified social isolating tendencies to be partly why; counselor and patient discussed patient fears and resourcing of cognitive restructuring skill set.  Patient processed experience of Thanksgiving, and intention to let go of concerning matters with her brother for the time being.  She identified plan for upcoming holidays.  She voiced working to sell belongings for income, and hopefulness around letter regarding eligibility  for SSI.  Counselor and patient discussed patient concerns and deliberations around this potentiality.  Counselor actively listened, affirmed patient feelings and experience, helped to facilitate insight and develop strategies for improvement regarding patient's varying concerns.  Counselor encouraged patient regarding healthy, supportive social outlets including possible church going.  Patient identified intention to pray regarding church engagement.  Interventions: Solution-Oriented/Positive Psychology, Humanistic/Existential, and Insight-Oriented  Diagnosis:   ICD-10-CM   1. Major depressive disorder, recurrent episode, mild  F33.0     2. Generalized anxiety disorder  F41.1     3. Delusional disorder, persecutory type, continuous (HCC)  F22       Plan: Patient is scheduled for follow-up; continue process work and developing coping skills.  Patient short-term goal between sessions to continue to look into SSI eligibility, continue to resource coping skills including cognitive restructuring efforts, prioritize rest and relaxation, and consider church going in resourcing of prayer/ spirituality.  [name], [facility]                   "

## 2024-11-09 ENCOUNTER — Encounter: Payer: Self-pay | Admitting: Professional Counselor

## 2024-11-09 ENCOUNTER — Ambulatory Visit: Admitting: Physician Assistant

## 2024-11-09 ENCOUNTER — Ambulatory Visit: Admitting: Professional Counselor

## 2024-11-09 ENCOUNTER — Encounter: Payer: Self-pay | Admitting: Physician Assistant

## 2024-11-09 DIAGNOSIS — F331 Major depressive disorder, recurrent, moderate: Secondary | ICD-10-CM

## 2024-11-09 DIAGNOSIS — Z560 Unemployment, unspecified: Secondary | ICD-10-CM | POA: Diagnosis not present

## 2024-11-09 DIAGNOSIS — G4700 Insomnia, unspecified: Secondary | ICD-10-CM | POA: Diagnosis not present

## 2024-11-09 DIAGNOSIS — F4323 Adjustment disorder with mixed anxiety and depressed mood: Secondary | ICD-10-CM | POA: Diagnosis not present

## 2024-11-09 DIAGNOSIS — F22 Delusional disorders: Secondary | ICD-10-CM

## 2024-11-09 DIAGNOSIS — F411 Generalized anxiety disorder: Secondary | ICD-10-CM

## 2024-11-09 MED ORDER — QUETIAPINE FUMARATE 50 MG PO TABS: 50.0000 mg | ORAL_TABLET | Freq: Every day | ORAL | 1 refills | Status: AC

## 2024-11-09 MED ORDER — BUPROPION HCL ER (XL) 300 MG PO TB24: 300.0000 mg | ORAL_TABLET | Freq: Every day | ORAL | 1 refills | Status: AC

## 2024-11-09 NOTE — Progress Notes (Signed)
 "     [facility] Med Check  Patient ID: Shari Barker,  MRN: [id number]  PCP: Sharl Tully HERO, PA-C  Date of Evaluation: 11/09/2024 Time spent:20 minutes  Chief Complaint:  Chief Complaint   Anxiety; Depression; Insomnia; Follow-up    HISTORY/CURRENT STATUS: HPI For routine med check.   States she's worn down.  Her son's benefits from his father's death will run out in Jan 23, 2025.  [name] still unemployed.  [name] pursuing re-selling things, like buying things from [place] and putting them [employer].  Very depressed and stressed. Not sleeping well even with the Seroquel .  Doesn't fall asleep right away but once she gets to sleep, she will sleep around 9 hours. Doesn't want to do anything.  Low energy and motivation.  Feels sad but has no feelings of hopelessness.  ADLs and personal hygiene are normal.   Denies any changes in concentration, making decisions, or remembering things.  Appetite has not changed.  No PA, just gets overwhelmed.  No mania, delirium, AH/VH.  No SI/HI.  [name] thinking about applying for disability.  She doesn't specify any physical reason for that.  The depression is uncontrolled at this time.   Individual Medical History/ Review of Systems: Changes? :No   Past medications for mental health diagnoses include: Risperdal a few times, she was afraid to take it. Wellbutrin , Buspar, Seroquel  for sleep and AH caused too much drowsiness   Never hosp for psych. No h/o self-harm  Allergies: Amoxicillin-pot clavulanate  Current Medications:  Current Outpatient Medications:    buPROPion  (WELLBUTRIN  XL) 300 MG 24 hr tablet, Take 1 tablet (300 mg total) by mouth daily., Disp: 90 tablet, Rfl: 1   busPIRone (BUSPAR) 15 MG tablet, Take 10 mg by mouth daily., Disp: , Rfl:    clobetasol ointment (TEMOVATE) 0.05 %, Apply 1 Application topically 2 (two) times daily., Disp: , Rfl:    Cyanocobalamin (VITAMIN B 12 PO), Take by mouth., Disp: , Rfl:    estradiol (ESTRACE) 0.1 MG/GM  vaginal cream, Place 1 Applicatorful vaginally at bedtime., Disp: , Rfl:    famotidine (PEPCID) 20 MG tablet, Take 20 mg by mouth 2 (two) times daily., Disp: , Rfl:    ketotifen (ZADITOR) 0.035 % ophthalmic solution, 1 drop in the morning and at bedtime., Disp: , Rfl:    levothyroxine (SYNTHROID) 112 MCG tablet, Take 112 mcg by mouth daily before breakfast., Disp: , Rfl:    lisinopril (ZESTRIL) 5 MG tablet, Take 5 mg by mouth daily., Disp: , Rfl:    QUEtiapine  (SEROQUEL ) 50 MG tablet, Take 1-2 tablets (50-100 mg total) by mouth at bedtime., Disp: 180 tablet, Rfl: 1   triamcinolone (KENALOG) 0.025 % ointment, Apply 1 Application topically 2 (two) times daily., Disp: , Rfl:    Triamcinolone Acetonide (NASACORT AQ NA), Place into the nose., Disp: , Rfl:    VITAMIN D PO, Take by mouth., Disp: , Rfl:    finasteride (PROSCAR) 5 MG tablet, Take 5 mg by mouth daily. (Patient not taking: Reported on 11/09/2024), Disp: , Rfl:  Medication Side Effects: hypersomnolence  Family Medical/ Social History: Changes? No  MENTAL HEALTH EXAM:  There were no vitals taken for this visit.There is no height or weight on file to calculate BMI.  General Appearance: Casual and Well Groomed  Eye Contact:  Good  Speech:  Clear and Coherent and Normal Rate  Volume:  Normal  Mood:  sad  Affect:  Congruent  Thought Process:  Goal Directed and Descriptions of Associations: Circumstantial  Orientation:  Full (Time, Place, and Person)  Thought Content: Logical   Suicidal Thoughts:  No  Homicidal Thoughts:  No  Memory:  WNL  Judgement:  Good  Insight:  Good  Psychomotor Activity:  Normal  Concentration:  Concentration: Good  Recall:  Good  Fund of Knowledge: Good  Language: Good  Assets:  Desire for Improvement Financial Resources/Insurance Housing Resilience Transportation  ADL's:  Intact  Cognition: WNL  Prognosis:  Good   DIAGNOSES:    ICD-10-CM   1. Situational mixed anxiety and depressive disorder   F43.23     2. Insomnia, unspecified type  G47.00     3. Unemployed  Z56.0       Receiving Psychotherapy: Yes   with Cato Sprang, [facility].  RECOMMENDATIONS:   PDMP reviewed.  Ativan filled 05/12/2023. I provided approximately 20 minutes of face to face time during this encounter, including time spent before and after the visit in records review, medical decision making, counseling pertinent to today's visit, and charting.   Discussed her symptoms.  I recommend increasing the Wellbutrin  to help with anhedonia.  Also recommend increasing Seroquel  to help with mood and sleep.  She asks about the possibility of getting on disability.  From a mental health standpoint she does have uncontrolled depression at this time.  Hopefully that will improve with the change in medication.  It is hard to say whether she qualifies for disability or not.  Recommend she discuss the application process with an attorney and go from there.  Increase Wellbutrin  XL to 300 mg, 1 q am. Continue Buspar 15 mg,1 daily. Increase seroquel  to 50 mg, 1-2 p.o. nightly. Continue vitamins per med list.  Return in 6-8 weeks.   Verneita Cooks, PA-C  "

## 2024-11-09 NOTE — Progress Notes (Unsigned)
"   °      [facility] Counselor/Therapist Progress Note  Patient ID: Shari Barker, MRN: [id number],    Date: 11/09/2024  Time Spent: 9:11 AM - 10:08 AM   Treatment Type:   Reported Symptoms: tearfulness, sadness, low mood, anhedonia, low motivation, worries, stress, fears, automatic negative thoughts, financial strain, catastrophic thinking, paranoia, fatigue, anxiousness, phase of life concerns, diminished self care  Mental Status Exam:  Appearance:   Casual     Behavior:  Appropriate and Sharing  Motor:  Normal  Speech/Language:   Clear and Coherent and Normal Rate  Affect:  Depressed and Tearful  Mood:  depressed and sad  Thought process:  normal  Thought content:    WNL  Sensory/Perceptual disturbances:    WNL  Orientation:  oriented to person, place, time/date, and situation  Attention:  Good  Concentration:  Good  Memory:  WNL  Fund of knowledge:   Good  Insight:    Good  Judgment:   Good  Impulse Control:  Good   Risk Assessment: Danger to Self:  No Self-injurious Behavior: No Danger to Others: No Duty to Warn:no Physical Aggression / Violence:No  Access to Firearms a concern: No  Gang Involvement:No   Subjective: Patient presented to session to address concerns of anxiety, depression, and paranoia.  She reported minimal progress at this time.  She processed experience of the holidays and emotionally upsetting dynamics.  She processed experience of approaching difficult conversation with family member to limited resolution; counselor reinforced patient self advocacy and emphasis on fairness in spite of resistance.  She identified distress regarding awareness per letter that her son would no longer receive Social Security when he turns 18 soon.  Patient identified considerable financial stress, including increase in fear around meeting basic needs.  Counselor encouraged patient to utilize community resources as needed.patient identified having looked into disability,  with intention to continue to approach.  Patient processed experience of paranoid episode in store, and counselor assisted with cognitive restructuring regarding incident.  Counselor encouraged patient strengths resourcing including her spirituality and having faith.  Interventions: Solution-Oriented/Positive Psychology, Humanistic/Existential, Insight-Oriented, and Resourcing   Diagnosis:   ICD-10-CM   1. Major depressive disorder, recurrent episode, moderate (HCC)  F33.1     2. Generalized anxiety disorder  F41.1     3. Delusional disorder, persecutory type, continuous (HCC)  F22       Plan: Patient is scheduled for follow-up; continue process work and developing coping skills.  Patient short-term goal between sessions to continue self efficacy, continue to look into support resources both community and regarding Social Security, continue to resource cognitive restructuring skill set and spirituality as coping mechanisms.  Shari Barker, [facility]                   "

## 2024-11-09 NOTE — Patient Instructions (Signed)
 We're changing the Seroquel  25 mg to 50 mg, and you'll take 1-2 pills every night for sleep.

## 2024-11-25 ENCOUNTER — Ambulatory Visit: Admitting: Professional Counselor

## 2024-11-25 ENCOUNTER — Encounter: Payer: Self-pay | Admitting: Professional Counselor

## 2024-11-25 DIAGNOSIS — F331 Major depressive disorder, recurrent, moderate: Secondary | ICD-10-CM

## 2024-11-25 DIAGNOSIS — F411 Generalized anxiety disorder: Secondary | ICD-10-CM | POA: Diagnosis not present

## 2024-11-25 DIAGNOSIS — F22 Delusional disorders: Secondary | ICD-10-CM

## 2024-11-25 NOTE — Progress Notes (Signed)
"   °      [facility] Counselor/Therapist Progress Note  Patient ID: Shari Barker, MRN: [id number],    Date: 11/25/2024  Time Spent: 9:16 AM to 10:15 AM  Treatment Type: Individual Therapy  Reported Symptoms: automatic negative thoughts, worries, anxiousness, sadness, low mood, motivational concerns, erratic sleep patterns, health concerns, phase of life concerns, irritability, catastrophic thinking, socially isolating tendencies, self esteem concerns, trouble concentrating   Mental Status Exam:  Appearance:   Casual     Behavior:  Appropriate and Sharing  Motor:  Normal  Speech/Language:   Clear and Coherent and Normal Rate  Affect:  Tearful  Mood:  sad and worried  Thought process:  normal  Thought content:    WNL  Sensory/Perceptual disturbances:    WNL  Orientation:  oriented to person, place, time/date, and situation  Attention:  Good  Concentration:  Good  Memory:  WNL  Fund of knowledge:   Good  Insight:    Good  Judgment:   Good  Impulse Control:  Good   Risk Assessment: Danger to Self:  No Self-injurious Behavior: No Danger to Others: No Duty to Warn:no Physical Aggression / Violence:No  Access to Firearms a concern: No  Gang Involvement:No   Subjective: Patient presented to session to address concerns of anxiety, depression, and paranoia.  Patient identified mixed progress at this time.  She reported progress in having lost weight and to be glad about that, and to be continuing to look into receiving disability.  Patient identified ongoing coping skill of her spirituality and Bible study.  She identified gratitude for her son doing well in school and with life.  Counselor celebrated progress and reinforced positivity with patient.  Patient identified ongoing concern regarding her health, automatic negative thoughts, and paranoid thought patterns.  She identified medication increased as helpful.  Counselor assisted patient with cognitive restructuring practice,  and in identifying adaptive coping mechanisms such as considering church attendance for both spiritual resourcing and social opportunities.  Interventions: Cognitive Behavioral Therapy, Solution-Oriented/Positive Psychology, Humanistic/Existential, and Insight-Oriented  Diagnosis:   ICD-10-CM   1. Major depressive disorder, recurrent episode, moderate (HCC)  F33.1     2. Generalized anxiety disorder  F41.1     3. Delusional disorder, persecutory type, continuous (HCC)  F22       Plan: Patient is scheduled for a follow-up; continue process work and developing coping skills.  Patient short-term goal between sessions to continue disability research, continue Bible study and consider church attendance, be mindful of reaching out regarding health and when she needs help, practice cognitive restructuring around automatic negative thoughts and other intrusive thought patterns.  [name], [facility]                   "

## 2024-12-09 ENCOUNTER — Ambulatory Visit: Admitting: Professional Counselor

## 2024-12-09 ENCOUNTER — Encounter: Payer: Self-pay | Admitting: Professional Counselor

## 2024-12-09 DIAGNOSIS — F331 Major depressive disorder, recurrent, moderate: Secondary | ICD-10-CM

## 2024-12-09 DIAGNOSIS — F22 Delusional disorders: Secondary | ICD-10-CM

## 2024-12-09 DIAGNOSIS — F411 Generalized anxiety disorder: Secondary | ICD-10-CM

## 2024-12-09 NOTE — Progress Notes (Unsigned)
"   °      [facility] Counselor/Therapist Progress Note  Patient ID: Shari Barker, MRN: [id number],    Date: 12/09/2024  Time Spent: ***   Treatment Type: Individual Therapy  I connected with this patient by an approved telecommunication method (audio only), with her informed consent, and verifying identity and patient privacy.  I was located at my office and patient at her home.  As needed, we discussed the limitations, risks, and security and privacy concerns associated with telehealth service, including the availability and conditions which currently govern in-person appointments and the possibility that 3rd-party payment may not be fully guaranteed and she may be responsible for charges.  After she indicated understanding, we proceeded with the session.  Also discussed treatment planning, as needed, including ongoing verbal agreement with the plan, the opportunity to ask and answer all questions, her demonstrated understanding of instructions, and her readiness to call the office should symptoms worsen or she feels she is in a crisis state and needs more immediate and tangible assistance.   Reported Symptoms: ***  Mental Status Exam:  Appearance:   N/a     Behavior:  Appropriate and Sharing  Motor:  N/a  Speech/Language:   Clear and Coherent and Normal Rate  Affect:  N/a  Mood:  depressed and sad  Thought process:  normal  Thought content:    WNL  Sensory/Perceptual disturbances:    WNL  Orientation:  oriented to person, place, time/date, and situation  Attention:  Good  Concentration:  Good  Memory:  WNL  Fund of knowledge:   Good  Insight:    Good  Judgment:   Good  Impulse Control:  Good   Risk Assessment: Danger to Self:  No Self-injurious Behavior: No Danger to Others: No Duty to Warn:no Physical Aggression / Violence:No  Access to Firearms a concern: No  Gang Involvement:No   Subjective: ***   Interventions: Solution-Oriented/Positive Psychology,  Humanistic/Existential, and Insight-Oriented  Diagnosis:   ICD-10-CM   1. Major depressive disorder, recurrent episode, moderate (HCC)  F33.1     2. Generalized anxiety disorder  F41.1     3. Delusional disorder, persecutory type, continuous (HCC)  F22       Plan: ***  Shari Barker, [facility]                   "

## 2024-12-22 ENCOUNTER — Ambulatory Visit: Admitting: Physician Assistant

## 2024-12-23 ENCOUNTER — Ambulatory Visit: Admitting: Professional Counselor

## 2025-01-07 ENCOUNTER — Ambulatory Visit: Admitting: Professional Counselor

## 2025-01-20 ENCOUNTER — Ambulatory Visit: Admitting: Professional Counselor

## 2025-02-03 ENCOUNTER — Ambulatory Visit: Admitting: Professional Counselor

## 2025-02-17 ENCOUNTER — Ambulatory Visit: Admitting: Professional Counselor
